# Patient Record
Sex: Male | Born: 1980 | Race: White | Hispanic: No | Marital: Married | Smoking: Never smoker
Health system: Southern US, Community
[De-identification: ages and names within clinical notes are randomized; demographics above are authoritative.]

## PROBLEM LIST (undated history)

## (undated) DIAGNOSIS — Z91018 Allergy to other foods: Secondary | ICD-10-CM

## (undated) HISTORY — PX: NO PAST SURGERIES: SHX2092

---
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
## 2006-03-27 ENCOUNTER — Emergency Department: Payer: Self-pay | Admitting: Unknown Physician Specialty

## 2012-06-05 ENCOUNTER — Ambulatory Visit (INDEPENDENT_AMBULATORY_CARE_PROVIDER_SITE_OTHER): Payer: Federal, State, Local not specified - PPO | Admitting: Internal Medicine

## 2012-06-05 ENCOUNTER — Ambulatory Visit (INDEPENDENT_AMBULATORY_CARE_PROVIDER_SITE_OTHER)
Admission: RE | Admit: 2012-06-05 | Discharge: 2012-06-05 | Disposition: A | Discharge status: Home / Self Care | Payer: Federal, State, Local not specified - PPO | Source: Ambulatory Visit | Attending: Internal Medicine | Admitting: Internal Medicine

## 2012-06-05 ENCOUNTER — Encounter: Payer: Self-pay | Admitting: Internal Medicine

## 2012-06-05 VITALS — BP 131/89 | HR 65 | Temp 98.6°F | Ht 68.0 in | Wt 248.0 lb

## 2012-06-05 DIAGNOSIS — R059 Cough, unspecified: Secondary | ICD-10-CM

## 2012-06-05 DIAGNOSIS — R05 Cough: Secondary | ICD-10-CM

## 2012-06-05 DIAGNOSIS — Z0000 Encounter for general adult medical examination without abnormal findings: Secondary | ICD-10-CM

## 2012-06-05 DIAGNOSIS — J40 Bronchitis, not specified as acute or chronic: Secondary | ICD-10-CM

## 2012-06-05 LAB — LDL CHOLESTEROL, DIRECT: Direct LDL: 137 mg/dL

## 2012-06-05 LAB — CBC WITH DIFFERENTIAL/PLATELET
Basophils Absolute: 0 10*3/uL (ref 0.0–0.1)
Basophils Relative: 0.5 % (ref 0.0–3.0)
Eosinophils Relative: 2.5 % (ref 0.0–5.0)
HCT: 46.6 % (ref 39.0–52.0)
Hemoglobin: 15.7 g/dL (ref 13.0–17.0)
Lymphocytes Relative: 27.5 % (ref 12.0–46.0)
Lymphs Abs: 2.3 10*3/uL (ref 0.7–4.0)
Monocytes Relative: 7.2 % (ref 3.0–12.0)
Neutro Abs: 5.1 10*3/uL (ref 1.4–7.7)
RBC: 5.01 Mil/uL (ref 4.22–5.81)
RDW: 12.8 % (ref 11.5–14.6)

## 2012-06-05 LAB — COMPREHENSIVE METABOLIC PANEL
ALT: 37 U/L (ref 0–53)
Albumin: 4.7 g/dL (ref 3.5–5.2)
Alkaline Phosphatase: 48 U/L (ref 39–117)
CO2: 27 mEq/L (ref 19–32)
Glucose, Bld: 93 mg/dL (ref 70–99)
Potassium: 3.9 mEq/L (ref 3.5–5.1)
Sodium: 139 mEq/L (ref 135–145)
Total Protein: 7.7 g/dL (ref 6.0–8.3)

## 2012-06-05 LAB — LIPID PANEL
Cholesterol: 206 mg/dL — ABNORMAL HIGH (ref 0–200)
Total CHOL/HDL Ratio: 5

## 2012-06-05 MED ORDER — LEVOFLOXACIN 500 MG PO TABS: 500.0000 mg | ORAL_TABLET | Freq: Every day | ORAL | Status: DC

## 2012-06-05 MED ORDER — BENZONATATE 200 MG PO CAPS: 200.0000 mg | ORAL_CAPSULE | Freq: Two times a day (BID) | ORAL | Status: DC | PRN

## 2012-06-05 NOTE — Progress Notes (Signed)
  Subjective:    Patient ID: Gabriel Dominguez, male    DOB: [birth year], 31 y.o.   MRN: [id number]  HPI 31 year old male presents to establish care. He reports that he has been generally healthy. He has never had a primary care physician. His primary concern today is cough. He reports he first developed cough approximately 3 months ago. The cough seems most intense in the evenings. It is productive of whitish sputum. He occasionally has coughing spells which are so intense that he vomits. He denies any chest pain, fever, chills, shortness of breath. He is not currently taking any medication for cough. He is not a smoker. He has no history of asthma. 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  Outpatient Encounter Prescriptions as of 06/05/2012  Medication Sig Dispense Refill  . benzonatate (TESSALON) 200 MG capsule Take 1 capsule (200 mg total) by mouth 2 (two) times daily as needed for cough.  20 capsule  0  . levofloxacin (LEVAQUIN) 500 MG tablet Take 1 tablet (500 mg total) by mouth daily.  7 tablet  0   BP 131/89  Pulse 65  Temp 98.6 F (37 C) (Oral)  Ht 5\' 8"  (1.727 m)  Wt 248 lb (112.492 kg)  BMI 37.71 kg/m2  SpO2 98%  Review of Systems  Constitutional: Negative for fever, chills, activity change, appetite change, fatigue and unexpected weight change.  HENT: Negative for rhinorrhea, postnasal drip and sinus pressure.   Eyes: Negative for visual disturbance.  Respiratory: Positive for cough. Negative for shortness of breath.   Cardiovascular: Negative for chest pain, palpitations and leg swelling.  Gastrointestinal: Negative for abdominal pain and abdominal distention.  Genitourinary: Negative for dysuria, urgency and difficulty urinating.  Musculoskeletal: Negative for arthralgias and gait problem.  Skin: Negative for color change and rash.  Hematological: Negative for adenopathy.  Psychiatric/Behavioral: Negative for sleep disturbance and dysphoric mood. The  patient is not nervous/anxious.        Objective:   Physical Exam  Constitutional: He is oriented to person, place, and time. He appears well-developed and well-nourished. No distress.  HENT:  Head: Normocephalic and atraumatic.  Right Ear: External ear normal.  Left Ear: External ear normal.  Nose: Nose normal.  Mouth/Throat: Oropharynx is clear and moist. No oropharyngeal exudate.  Eyes: Conjunctivae normal and EOM are normal. Pupils are equal, round, and reactive to light. Right eye exhibits no discharge. Left eye exhibits no discharge. No scleral icterus.  Neck: Normal range of motion. Neck supple. No tracheal deviation present. No thyromegaly present.  Cardiovascular: Normal rate, regular rhythm and normal heart sounds.  Exam reveals no gallop and no friction rub.   No murmur heard. Pulmonary/Chest: Effort normal. No accessory muscle usage. Not tachypneic. No respiratory distress. He has no decreased breath sounds. He has no wheezes. He has rhonchi in the right lower field. He has no rales. He exhibits no tenderness.  Musculoskeletal: Normal range of motion. He exhibits no edema.  Lymphadenopathy:    He has no cervical adenopathy.  Neurological: He is alert and oriented to person, place, and time. No cranial nerve deficit. Coordination normal.  Skin: Skin is warm and dry. No rash noted. He is not diaphoretic. No erythema. No pallor.  Psychiatric: He has a normal mood and affect. His behavior is normal. Judgment and thought content normal.          Assessment & Plan:

## 2012-06-05 NOTE — Assessment & Plan Note (Signed)
Persistent cough x3 months most consistent with viral infection versus pertussis. Chest x-ray is normal today. Given rhonchi noted in right lower lobe on exam, will treat for possible bronchitis with Levaquin. Patient will call if symptoms are not improving. Followup one month.

## 2012-06-05 NOTE — Assessment & Plan Note (Signed)
Exam is most consistent with secondary bronchitis by reduction. Will treat with Levaquin. Patient will call if symptoms are not improving over the next few days. If no improvement, would favor additional evaluation with pulmonary function testing given history of chronic exposure to environmental toxins at his work.

## 2012-06-07 LAB — BORDETELLA PERTUSSIS ANTIBODY: B pertussis IgA Ab, Quant: 0.6 U/mL (ref <=1.1)

## 2012-07-03 ENCOUNTER — Ambulatory Visit (INDEPENDENT_AMBULATORY_CARE_PROVIDER_SITE_OTHER): Payer: Federal, State, Local not specified - PPO | Admitting: Internal Medicine

## 2012-07-03 ENCOUNTER — Encounter: Payer: Self-pay | Admitting: Internal Medicine

## 2012-07-03 VITALS — BP 122/86 | HR 74 | Temp 97.9°F | Resp 16 | Wt 249.2 lb

## 2012-07-03 DIAGNOSIS — Z1331 Encounter for screening for depression: Secondary | ICD-10-CM

## 2012-07-03 DIAGNOSIS — R05 Cough: Secondary | ICD-10-CM

## 2012-07-03 DIAGNOSIS — E669 Obesity, unspecified: Secondary | ICD-10-CM

## 2012-07-03 NOTE — Assessment & Plan Note (Signed)
BMI 37. Encouarged healthy Mediterranean style diet low in saturated fat and high in fiber. Encouraged regular physical activity with goal of 40 minutes 3 times per week.

## 2012-07-03 NOTE — Progress Notes (Signed)
  Subjective:    Patient ID: Gabriel Dominguez, male    DOB: 1980/08/05, 31 y.o.   MRN: [id number]  HPI 31 year old male presents for followup of cough. At his last visit, he complained of several month history of cough. He was treated with Levaquin for bronchitis. He reports that symptoms of cough have almost completely resolved. He denies any shortness of breath, fever, chills, chest pain. He reports he is generally feeling well. He has no new concerns today.  Outpatient Encounter Prescriptions as of 07/03/2012  Medication Sig Dispense Refill   BP 122/86  Pulse 74  Temp 97.9 F (36.6 C) (Oral)  Resp 16  Wt 249 lb 4 oz (113.059 kg)  Review of Systems  Constitutional: Negative for fever, chills, activity change, appetite change, fatigue and unexpected weight change.  Eyes: Negative for visual disturbance.  Respiratory: Negative for cough and shortness of breath.   Cardiovascular: Negative for chest pain, palpitations and leg swelling.  Gastrointestinal: Negative for abdominal pain and abdominal distention.  Genitourinary: Negative for dysuria, urgency and difficulty urinating.  Musculoskeletal: Negative for arthralgias and gait problem.  Skin: Negative for color change and rash.  Hematological: Negative for adenopathy.  Psychiatric/Behavioral: Negative for sleep disturbance and dysphoric mood. The patient is not nervous/anxious.        Objective:   Physical Exam  Constitutional: He is oriented to person, place, and time. He appears well-developed and well-nourished. No distress.  HENT:  Head: Normocephalic and atraumatic.  Right Ear: External ear normal.  Left Ear: External ear normal.  Nose: Nose normal.  Mouth/Throat: Oropharynx is clear and moist. No oropharyngeal exudate.  Eyes: Conjunctivae normal and EOM are normal. Pupils are equal, round, and reactive to light. Right eye exhibits no discharge. Left eye exhibits no discharge. No scleral icterus.  Neck: Normal range of motion.  Neck supple. No tracheal deviation present. No thyromegaly present.  Cardiovascular: Normal rate, regular rhythm and normal heart sounds.  Exam reveals no gallop and no friction rub.   No murmur heard. Pulmonary/Chest: Effort normal and breath sounds normal. No accessory muscle usage. Not tachypneic. No respiratory distress. He has no decreased breath sounds. He has no wheezes. He has no rhonchi. He has no rales. He exhibits no tenderness.  Musculoskeletal: Normal range of motion. He exhibits no edema.  Lymphadenopathy:    He has no cervical adenopathy.  Neurological: He is alert and oriented to person, place, and time. No cranial nerve deficit. Coordination normal.  Skin: Skin is warm and dry. No rash noted. He is not diaphoretic. No erythema. No pallor.  Psychiatric: He has a normal mood and affect. His behavior is normal. Judgment and thought content normal.          Assessment & Plan:

## 2012-07-03 NOTE — Assessment & Plan Note (Addendum)
Cough has markedly improved after Rx with Levaquin. CXR was normal. Will continue to monitor. Pt does have some exposure to pulmonary toxins with work. We discussed potential PFTsj and pulmonary referral if symptoms of cough persistent. Otherwise, follow up 1 year and prn.

## 2012-09-07 ENCOUNTER — Ambulatory Visit: Payer: Self-pay | Admitting: Physician Assistant

## 2012-10-26 ENCOUNTER — Emergency Department: Payer: Self-pay | Admitting: Emergency Medicine

## 2013-03-03 ENCOUNTER — Encounter: Payer: Self-pay | Admitting: Internal Medicine

## 2013-04-16 ENCOUNTER — Ambulatory Visit (INDEPENDENT_AMBULATORY_CARE_PROVIDER_SITE_OTHER): Payer: Federal, State, Local not specified - PPO | Admitting: Internal Medicine

## 2013-04-16 ENCOUNTER — Encounter: Payer: Self-pay | Admitting: Internal Medicine

## 2013-04-16 VITALS — BP 120/88 | HR 74 | Temp 97.9°F | Wt 252.0 lb

## 2013-04-16 DIAGNOSIS — J0100 Acute maxillary sinusitis, unspecified: Secondary | ICD-10-CM | POA: Insufficient documentation

## 2013-04-16 MED ORDER — AMOXICILLIN-POT CLAVULANATE 875-125 MG PO TABS: 1.0000 | ORAL_TABLET | Freq: Two times a day (BID) | ORAL | Status: DC

## 2013-04-16 NOTE — Assessment & Plan Note (Signed)
Symptoms and exam consistent with acute maxillary sinusitis and bilateral OM. Will start Augmentin. Will start ClaritinD to help with congestion. Encouraged rest and adequate fluid intake. Will recheck ears in 4 weeks and prn.

## 2013-04-16 NOTE — Patient Instructions (Signed)
Try starting Claritin-D 12hour for congestion.  Start Augmentin for ear and sinus infection.  Recheck in 4 weeks or sooner if needed.

## 2013-04-16 NOTE — Progress Notes (Signed)
  Subjective:    Patient ID: Gabriel Dominguez, male    DOB: 02/01/1981, 32 y.o.   MRN: [id number]  HPI 32YO male presents for acute visit c/o 1 month of congestion.  Initially started with clear nasal drainage, malaise. Took benadryl with minimal improvement. Over last 2 weeks, has developed increased pressure right maxilla and right ear. Denies fever or chills. Denies cough. Not taking any meds for this at present.  Outpatient Encounter Prescriptions as of 04/16/2013  Medication Sig Dispense Refill   No facility-administered encounter medications on file as of 04/16/2013.   BP 120/88  Pulse 74  Temp(Src) 97.9 F (36.6 C) (Oral)  Wt 252 lb (114.306 kg)  BMI 38.33 kg/m2  SpO2 94%  Review of Systems  Constitutional: Positive for fatigue. Negative for fever, chills and activity change.  HENT: Positive for ear pain, congestion, postnasal drip and sinus pressure. Negative for hearing loss, nosebleeds, sore throat, rhinorrhea, sneezing, trouble swallowing, neck pain, neck stiffness, voice change, tinnitus and ear discharge.   Eyes: Negative for discharge, redness, itching and visual disturbance.  Respiratory: Negative for cough, chest tightness, shortness of breath, wheezing and stridor.   Cardiovascular: Negative for chest pain and leg swelling.  Musculoskeletal: Negative for myalgias and arthralgias.  Skin: Negative for color change and rash.  Neurological: Negative for dizziness, facial asymmetry and headaches.  Psychiatric/Behavioral: Negative for sleep disturbance.       Objective:   Physical Exam  Constitutional: He is oriented to person, place, and time. He appears well-developed and well-nourished. No distress.  HENT:  Head: Normocephalic and atraumatic.  Right Ear: External ear normal. Tympanic membrane is erythematous and bulging. A middle ear effusion is present.  Left Ear: External ear normal. Tympanic membrane is erythematous and bulging. A middle ear effusion is present.   Nose: Mucosal edema, rhinorrhea and sinus tenderness present.  Mouth/Throat: Oropharynx is clear and moist. No oropharyngeal exudate.  Eyes: Conjunctivae and EOM are normal. Pupils are equal, round, and reactive to light. Right eye exhibits no discharge. Left eye exhibits no discharge. No scleral icterus.  Neck: Normal range of motion. Neck supple. No tracheal deviation present. No thyromegaly present.  Cardiovascular: Normal rate, regular rhythm and normal heart sounds.  Exam reveals no gallop and no friction rub.   No murmur heard. Pulmonary/Chest: Effort normal and breath sounds normal. No respiratory distress. He has no wheezes. He has no rales. He exhibits no tenderness.  Musculoskeletal: Normal range of motion. He exhibits no edema.  Lymphadenopathy:    He has no cervical adenopathy.  Neurological: He is alert and oriented to person, place, and time. No cranial nerve deficit. Coordination normal.  Skin: Skin is warm and dry. No rash noted. He is not diaphoretic. No erythema. No pallor.  Psychiatric: He has a normal mood and affect. His behavior is normal. Judgment and thought content normal.          Assessment & Plan:

## 2013-05-13 ENCOUNTER — Ambulatory Visit: Payer: Federal, State, Local not specified - PPO | Admitting: Internal Medicine

## 2013-05-22 ENCOUNTER — Other Ambulatory Visit: Payer: Self-pay

## 2013-07-03 ENCOUNTER — Encounter: Payer: Federal, State, Local not specified - PPO | Admitting: Internal Medicine

## 2013-07-04 ENCOUNTER — Encounter: Payer: Self-pay | Admitting: Internal Medicine

## 2013-07-04 ENCOUNTER — Ambulatory Visit (INDEPENDENT_AMBULATORY_CARE_PROVIDER_SITE_OTHER): Payer: Federal, State, Local not specified - PPO | Admitting: Internal Medicine

## 2013-07-04 VITALS — BP 130/70 | HR 90 | Temp 97.8°F | Ht 68.0 in | Wt 260.0 lb

## 2013-07-04 DIAGNOSIS — Z0000 Encounter for general adult medical examination without abnormal findings: Secondary | ICD-10-CM

## 2013-07-04 DIAGNOSIS — J0100 Acute maxillary sinusitis, unspecified: Secondary | ICD-10-CM

## 2013-07-04 DIAGNOSIS — E669 Obesity, unspecified: Secondary | ICD-10-CM

## 2013-07-04 LAB — CBC WITH DIFFERENTIAL/PLATELET
Basophils Absolute: 0 10*3/uL (ref 0.0–0.1)
Basophils Relative: 0 % (ref 0–1)
HCT: 44.4 % (ref 39.0–52.0)
Hemoglobin: 15.9 g/dL (ref 13.0–17.0)
Lymphocytes Relative: 33 % (ref 12–46)
Lymphs Abs: 3 10*3/uL (ref 0.7–4.0)
MCV: 88.3 fL (ref 78.0–100.0)
Monocytes Absolute: 0.8 10*3/uL (ref 0.1–1.0)
Monocytes Relative: 9 % (ref 3–12)
Neutro Abs: 4.8 10*3/uL (ref 1.7–7.7)
Neutrophils Relative %: 53 % (ref 43–77)
RDW: 13.4 % (ref 11.5–15.5)
WBC: 9.1 10*3/uL (ref 4.0–10.5)

## 2013-07-04 LAB — COMPREHENSIVE METABOLIC PANEL
ALT: 42 U/L (ref 0–53)
AST: 30 U/L (ref 0–37)
Albumin: 4.7 g/dL (ref 3.5–5.2)
Alkaline Phosphatase: 51 U/L (ref 39–117)
BUN: 13 mg/dL (ref 6–23)
CO2: 27 mEq/L (ref 19–32)
Creat: 1.15 mg/dL (ref 0.50–1.35)
Potassium: 4.3 mEq/L (ref 3.5–5.3)
Sodium: 137 mEq/L (ref 135–145)
Total Bilirubin: 0.5 mg/dL (ref 0.3–1.2)
Total Protein: 6.7 g/dL (ref 6.0–8.3)

## 2013-07-04 LAB — LIPID PANEL
HDL: 42 mg/dL (ref >39)
LDL Cholesterol: 115 mg/dL — ABNORMAL HIGH (ref 0–99)
VLDL: 45 mg/dL — ABNORMAL HIGH (ref 0–40)

## 2013-07-04 MED ORDER — GUAIFENESIN-CODEINE 100-10 MG/5ML PO SYRP: 5.0000 mL | ORAL_SOLUTION | Freq: Two times a day (BID) | ORAL | Status: DC | PRN

## 2013-07-04 MED ORDER — LEVOFLOXACIN 500 MG PO TABS: 500.0000 mg | ORAL_TABLET | Freq: Every day | ORAL | Status: DC

## 2013-07-04 NOTE — Progress Notes (Signed)
Pre visit review using our clinic review tool, if applicable. No additional management support is needed unless otherwise documented below in the visit note. 

## 2013-07-05 DIAGNOSIS — Z0000 Encounter for general adult medical examination without abnormal findings: Secondary | ICD-10-CM | POA: Insufficient documentation

## 2013-07-05 NOTE — Assessment & Plan Note (Signed)
General medical exam normal today. Encouraged healthy diet and regular physical activity. Immunizations are up-to-date. Will check labs including CBC, CMP, lipid profile.

## 2013-07-05 NOTE — Assessment & Plan Note (Signed)
Symptoms and exam consistent with recurrent maxillary sinusitis. Will start Levaquin. Will use Codeine as needed for cough. Follow up if no improvement over next few days. We discussed referral to ENT if persistent symptoms, given recurrent h/o sinusitis.

## 2013-07-05 NOTE — Progress Notes (Signed)
   Subjective:    Patient ID: Gabriel Dominguez, male    DOB: September 15, 1980, 32 y.o.   MRN: [id number]  HPI 32 year old male presents for annual exam. He reports that he is generally feeling well except for recurrent symptoms of nasal congestion, sinus pressure and pain, and purulent sinus drainage. Symptoms initially improved with use of Augmentin in October 2014, however they have recurred over the last couple of weeks. He denies any fever or chills. He does have some nonproductive cough. He denies shortness of breath.  Aside from this, no concerns. He is up-to-date on his immunizations. He is planning to start a healthier diet at the start of the new year. He is also planning to start an exercise program. He is not currently following any diet or exercise program.  Outpatient Encounter Prescriptions as of 07/04/2013  Medication Sig   BP 130/70  Pulse 90  Temp(Src) 97.8 F (36.6 C) (Oral)  Ht 5\' 8"  (1.727 m)  Wt 260 lb (117.935 kg)  BMI 39.54 kg/m2  SpO2 95%  Review of Systems  Constitutional: Negative for fever, chills, activity change, appetite change, fatigue and unexpected weight change.  HENT: Positive for congestion, postnasal drip, rhinorrhea and sinus pressure.   Eyes: Negative for visual disturbance.  Respiratory: Positive for cough. Negative for shortness of breath.   Cardiovascular: Negative for chest pain, palpitations and leg swelling.  Gastrointestinal: Negative for abdominal pain and abdominal distention.  Genitourinary: Negative for dysuria, urgency and difficulty urinating.  Musculoskeletal: Negative for arthralgias and gait problem.  Skin: Negative for color change and rash.  Hematological: Negative for adenopathy.  Psychiatric/Behavioral: Negative for sleep disturbance and dysphoric mood. The patient is not nervous/anxious.        Objective:   Physical Exam  Constitutional: He is oriented to person, place, and time. He appears well-developed and well-nourished. No  distress.  HENT:  Head: Normocephalic and atraumatic.  Right Ear: External ear normal. A middle ear effusion is present.  Left Ear: External ear normal. A middle ear effusion is present.  Nose: Mucosal edema present. Right sinus exhibits maxillary sinus tenderness. Left sinus exhibits maxillary sinus tenderness.  Mouth/Throat: Oropharynx is clear and moist. No oropharyngeal exudate.  Eyes: Conjunctivae and EOM are normal. Pupils are equal, round, and reactive to light. Right eye exhibits no discharge. Left eye exhibits no discharge. No scleral icterus.  Neck: Normal range of motion. Neck supple. No tracheal deviation present. No thyromegaly present.  Cardiovascular: Normal rate, regular rhythm and normal heart sounds.  Exam reveals no gallop and no friction rub.   No murmur heard. Pulmonary/Chest: Effort normal and breath sounds normal. No respiratory distress. He has no wheezes. He has no rales. He exhibits no tenderness.  Musculoskeletal: Normal range of motion. He exhibits no edema.  Lymphadenopathy:    He has no cervical adenopathy.  Neurological: He is alert and oriented to person, place, and time. No cranial nerve deficit. Coordination normal.  Skin: Skin is warm and dry. No rash noted. He is not diaphoretic. No erythema. No pallor.  Psychiatric: He has a normal mood and affect. His behavior is normal. Judgment and thought content normal.          Assessment & Plan:

## 2013-07-05 NOTE — Assessment & Plan Note (Signed)
Wt Readings from Last 3 Encounters:  07/04/13 260 lb (117.935 kg)  04/16/13 252 lb (114.306 kg)  07/03/12 249 lb 4 oz (113.059 kg)   Body mass index is 39.54 kg/(m^2).  Encouraged healthy, Mediterranean style diet and regular exercise with goal of exercise 3x per week.

## 2013-07-20 ENCOUNTER — Encounter: Payer: Self-pay | Admitting: Internal Medicine

## 2013-07-22 ENCOUNTER — Encounter: Payer: Self-pay | Admitting: Internal Medicine

## 2013-07-22 ENCOUNTER — Ambulatory Visit (INDEPENDENT_AMBULATORY_CARE_PROVIDER_SITE_OTHER): Payer: Federal, State, Local not specified - PPO | Admitting: Internal Medicine

## 2013-07-22 VITALS — BP 110/80 | HR 94 | Temp 98.2°F | Wt 259.0 lb

## 2013-07-22 DIAGNOSIS — J0100 Acute maxillary sinusitis, unspecified: Secondary | ICD-10-CM

## 2013-07-22 DIAGNOSIS — J0101 Acute recurrent maxillary sinusitis: Secondary | ICD-10-CM

## 2013-07-22 MED ORDER — PREDNISONE (PAK) 10 MG PO TABS: ORAL_TABLET | ORAL | Status: DC

## 2013-07-22 MED ORDER — GUAIFENESIN-CODEINE 100-10 MG/5ML PO SYRP: 5.0000 mL | ORAL_SOLUTION | Freq: Two times a day (BID) | ORAL | Status: DC | PRN

## 2013-07-22 MED ORDER — SULFAMETHOXAZOLE-TMP DS 800-160 MG PO TABS: 1.0000 | ORAL_TABLET | Freq: Two times a day (BID) | ORAL | Status: DC

## 2013-07-22 NOTE — Progress Notes (Signed)
Pre-visit discussion using our clinic review tool. No additional management support is needed unless otherwise documented below in the visit note.  

## 2013-07-22 NOTE — Assessment & Plan Note (Signed)
Persistent acute maxillary sinusitis. No improvement with Levaquin. Question if he may have obstruction leading to chronic infection. Will set up ENT evaluation. Will start Prednisone and Bactrim. Codeine prn cough. Follow up 1/20.

## 2013-07-22 NOTE — Progress Notes (Signed)
   Subjective:    Patient ID: Gabriel Dominguez, male    DOB: 1981-01-13, 33 y.o.   MRN: [id number]  HPI 33YO male with h/o sinusitis, treated with Levaquin in 06/2013, presents for follow up. Persistent symptoms of sinus congestion, left cheek pressure and pain, and non-productive cough. No fever, chills, shortness of breath. Completed Levaquin with no improvement.  Outpatient Encounter Prescriptions as of 07/22/2013  Medication Sig   BP 110/80  Pulse 94  Temp(Src) 98.2 F (36.8 C) (Oral)  Wt 259 lb (117.482 kg)  SpO2 97%  Review of Systems  Constitutional: Positive for fatigue. Negative for fever, chills and activity change.  HENT: Positive for congestion and sinus pressure. Negative for ear discharge, ear pain, hearing loss, nosebleeds, postnasal drip, rhinorrhea, sneezing, sore throat, tinnitus, trouble swallowing and voice change.   Eyes: Negative for discharge, redness, itching and visual disturbance.  Respiratory: Positive for cough. Negative for chest tightness, shortness of breath, wheezing and stridor.   Cardiovascular: Negative for chest pain and leg swelling.  Musculoskeletal: Negative for arthralgias, myalgias, neck pain and neck stiffness.  Skin: Negative for color change and rash.  Neurological: Negative for dizziness, facial asymmetry and headaches.  Psychiatric/Behavioral: Negative for sleep disturbance.       Objective:   Physical Exam  Constitutional: He is oriented to person, place, and time. He appears well-developed and well-nourished. No distress.  HENT:  Head: Normocephalic and atraumatic.  Right Ear: External ear normal.  Left Ear: External ear normal.  Nose: Mucosal edema and rhinorrhea present. Right sinus exhibits maxillary sinus tenderness. Left sinus exhibits maxillary sinus tenderness.  Mouth/Throat: Oropharynx is clear and moist. No oropharyngeal exudate.  Eyes: Conjunctivae and EOM are normal. Pupils are equal, round, and reactive to light. Right eye  exhibits no discharge. Left eye exhibits no discharge. No scleral icterus.  Neck: Normal range of motion. Neck supple. No tracheal deviation present. No thyromegaly present.  Cardiovascular: Normal rate, regular rhythm and normal heart sounds.  Exam reveals no gallop and no friction rub.   No murmur heard. Pulmonary/Chest: Effort normal and breath sounds normal. No accessory muscle usage. Not tachypneic. No respiratory distress. He has no decreased breath sounds. He has no wheezes. He has no rhonchi. He has no rales. He exhibits no tenderness.  Musculoskeletal: Normal range of motion. He exhibits no edema.  Lymphadenopathy:    He has no cervical adenopathy.  Neurological: He is alert and oriented to person, place, and time. No cranial nerve deficit. Coordination normal.  Skin: Skin is warm and dry. No rash noted. He is not diaphoretic. No erythema. No pallor.  Psychiatric: He has a normal mood and affect. His behavior is normal. Judgment and thought content normal.          Assessment & Plan:

## 2013-08-05 ENCOUNTER — Ambulatory Visit (INDEPENDENT_AMBULATORY_CARE_PROVIDER_SITE_OTHER): Payer: Federal, State, Local not specified - PPO | Admitting: Internal Medicine

## 2013-08-05 ENCOUNTER — Encounter: Payer: Self-pay | Admitting: Internal Medicine

## 2013-08-05 VITALS — BP 120/80 | HR 112 | Temp 98.7°F | Wt 262.0 lb

## 2013-08-05 DIAGNOSIS — E669 Obesity, unspecified: Secondary | ICD-10-CM

## 2013-08-05 DIAGNOSIS — J0100 Acute maxillary sinusitis, unspecified: Secondary | ICD-10-CM

## 2013-08-05 MED ORDER — PHENTERMINE HCL 37.5 MG PO CAPS: 37.5000 mg | ORAL_CAPSULE | ORAL | Status: DC

## 2013-08-05 NOTE — Progress Notes (Signed)
Pre-visit discussion using our clinic review tool. No additional management support is needed unless otherwise documented below in the visit note.  

## 2013-08-06 NOTE — Progress Notes (Signed)
   Subjective:    Patient ID: Gabriel Dominguez, male    DOB: [birth year], 33 y.o.   MRN: [id number]  HPI 33 year old male with history of recurrent sinusitis presents for followup. At his last visit, he had persistent symptoms of sinus congestion and pain despite treatment with multiple antibiotics and prednisone. We set him up with ENT for evaluation. He reports that they did CT of the sinuses which showed complete obstruction of the right maxillary sinus. They also performed a culture which showed MRSA per his report. He has been started on clindamycin 3 times daily and notes some improvement in pain and pressure in his right sinus. He denies any fever, chills, cough.  He is concerned about his weight. He is interested in starting medication to help suppress his appetite. He is trying to follow a healthy diet and get regular physical activity.  Outpatient Encounter Prescriptions as of 08/05/2013  Medication Sig  . clindamycin (CLEOCIN) 300 MG capsule Take 300 mg by mouth 3 (three) times daily.     Review of Systems  Constitutional: Negative for fever, chills, activity change, appetite change, fatigue and unexpected weight change.  HENT: Positive for congestion and sinus pressure.   Eyes: Negative for visual disturbance.  Respiratory: Negative for cough and shortness of breath.   Cardiovascular: Negative for chest pain, palpitations and leg swelling.  Gastrointestinal: Negative for abdominal pain and abdominal distention.  Genitourinary: Negative for dysuria, urgency and difficulty urinating.  Musculoskeletal: Negative for arthralgias and gait problem.  Skin: Negative for color change and rash.  Hematological: Negative for adenopathy.  Psychiatric/Behavioral: Negative for sleep disturbance and dysphoric mood. The patient is not nervous/anxious.        Objective:   Physical Exam  Constitutional: He is oriented to person, place, and time. He appears well-developed and well-nourished. No  distress.  HENT:  Head: Normocephalic and atraumatic.  Right Ear: External ear normal.  Left Ear: External ear normal.  Nose: Mucosal edema present. Right sinus exhibits maxillary sinus tenderness.  Mouth/Throat: Oropharynx is clear and moist. No oropharyngeal exudate.  Eyes: Conjunctivae and EOM are normal. Pupils are equal, round, and reactive to light. Right eye exhibits no discharge. Left eye exhibits no discharge. No scleral icterus.  Neck: Normal range of motion. Neck supple. No tracheal deviation present. No thyromegaly present.  Cardiovascular: Normal rate, regular rhythm and normal heart sounds.  Exam reveals no gallop and no friction rub.   No murmur heard. Pulmonary/Chest: Effort normal and breath sounds normal. No respiratory distress. He has no wheezes. He has no rales. He exhibits no tenderness.  Musculoskeletal: Normal range of motion. He exhibits no edema.  Lymphadenopathy:    He has no cervical adenopathy.  Neurological: He is alert and oriented to person, place, and time. No cranial nerve deficit. Coordination normal.  Skin: Skin is warm and dry. No rash noted. He is not diaphoretic. No erythema. No pallor.  Psychiatric: He has a normal mood and affect. His behavior is normal. Judgment and thought content normal.          Assessment & Plan:

## 2013-08-06 NOTE — Assessment & Plan Note (Signed)
Wt Readings from Last 3 Encounters:  08/05/13 262 lb (118.842 kg)  07/22/13 259 lb (117.482 kg)  07/04/13 260 lb (117.935 kg)   Encouraged caloric restriction, high fiber, low sugar diet. Will start phentermine to help with appetite suppression. We discussed potential side effects of phentermine, including palpitations, dry mouth, constipation, trouble sleeping. He will stop the medication if any problems arise.

## 2013-08-06 NOTE — Assessment & Plan Note (Signed)
Persistent sinusitis, seen by ENT, had culture which showed MRSA by report. Will request records on this. On Clindamycin with some improvement. Will follow.

## 2013-09-05 ENCOUNTER — Encounter: Payer: Self-pay | Admitting: Internal Medicine

## 2013-09-11 ENCOUNTER — Ambulatory Visit: Payer: Federal, State, Local not specified - PPO | Admitting: Internal Medicine

## 2013-10-14 ENCOUNTER — Encounter: Payer: Self-pay | Admitting: Internal Medicine

## 2013-10-14 ENCOUNTER — Ambulatory Visit (INDEPENDENT_AMBULATORY_CARE_PROVIDER_SITE_OTHER): Payer: Federal, State, Local not specified - PPO | Admitting: Internal Medicine

## 2013-10-14 VITALS — BP 108/80 | HR 89 | Temp 98.2°F | Wt 223.0 lb

## 2013-10-14 DIAGNOSIS — E669 Obesity, unspecified: Secondary | ICD-10-CM

## 2013-10-14 MED ORDER — PHENTERMINE HCL 37.5 MG PO CAPS: 37.5000 mg | ORAL_CAPSULE | ORAL | Status: DC

## 2013-10-14 NOTE — Progress Notes (Signed)
Pre visit review using our clinic review tool, if applicable. No additional management support is needed unless otherwise documented below in the visit note. 

## 2013-10-14 NOTE — Assessment & Plan Note (Addendum)
Wt Readings from Last 3 Encounters:  10/14/13 223 lb (101.152 kg)  08/05/13 262 lb (118.842 kg)  07/22/13 259 lb (117.482 kg)   Congratulated pt on weight loss. Encouraged continued efforts at healthy diet and regular exercise. Will continue phentermine to help with appetite suppression. Follow up 3 months and prn.

## 2013-10-14 NOTE — Progress Notes (Signed)
   Subjective:    Patient ID: Gabriel Dominguez, male    DOB: Sep 30, 1980, 33 y.o.   MRN: [id number]  HPI 33YO male with obesity presents for follow up. Following healthy diet, eliminated sweetened beverages and fried foods. Staying physically active. Taking phentermine to help with appetite suppression. No side effects noted from this medication.  Review of Systems  Constitutional: Negative for fever, chills, activity change, appetite change, fatigue and unexpected weight change.  Eyes: Negative for visual disturbance.  Respiratory: Negative for cough and shortness of breath.   Cardiovascular: Negative for chest pain, palpitations and leg swelling.  Gastrointestinal: Negative for abdominal pain and abdominal distention.  Genitourinary: Negative for dysuria, urgency and difficulty urinating.  Musculoskeletal: Negative for arthralgias and gait problem.  Skin: Negative for color change and rash.  Hematological: Negative for adenopathy.  Psychiatric/Behavioral: Negative for sleep disturbance and dysphoric mood. The patient is not nervous/anxious.        Objective:    BP 108/80  Pulse 89  Temp(Src) 98.2 F (36.8 C) (Oral)  Wt 223 lb (101.152 kg)  SpO2 97% Physical Exam  Constitutional: He is oriented to person, place, and time. He appears well-developed and well-nourished. No distress.  HENT:  Head: Normocephalic and atraumatic.  Right Ear: External ear normal.  Left Ear: External ear normal.  Nose: Nose normal.  Mouth/Throat: Oropharynx is clear and moist. No oropharyngeal exudate.  Eyes: Conjunctivae and EOM are normal. Pupils are equal, round, and reactive to light. Right eye exhibits no discharge. Left eye exhibits no discharge. No scleral icterus.  Neck: Normal range of motion. Neck supple. No tracheal deviation present. No thyromegaly present.  Cardiovascular: Normal rate, regular rhythm and normal heart sounds.  Exam reveals no gallop and no friction rub.   No murmur  heard. Pulmonary/Chest: Effort normal and breath sounds normal. No accessory muscle usage. Not tachypneic. No respiratory distress. He has no decreased breath sounds. He has no wheezes. He has no rhonchi. He has no rales. He exhibits no tenderness.  Musculoskeletal: Normal range of motion. He exhibits no edema.  Lymphadenopathy:    He has no cervical adenopathy.  Neurological: He is alert and oriented to person, place, and time. No cranial nerve deficit. Coordination normal.  Skin: Skin is warm and dry. No rash noted. He is not diaphoretic. No erythema. No pallor.  Psychiatric: He has a normal mood and affect. His behavior is normal. Judgment and thought content normal.          Assessment & Plan:   Problem List Items Addressed This Visit   Obesity (BMI 30-39.9) - Primary      Wt Readings from Last 3 Encounters:  10/14/13 223 lb (101.152 kg)  08/05/13 262 lb (118.842 kg)  07/22/13 259 lb (117.482 kg)   Congratulated pt on weight loss. Encouraged continued efforts at healthy diet and regular exercise. Will continue phentermine to help with appetite suppression. Follow up 3 months and prn.    Relevant Medications      phentermine capsule       Return in about 3 months (around 01/13/2014).

## 2014-01-13 ENCOUNTER — Ambulatory Visit (INDEPENDENT_AMBULATORY_CARE_PROVIDER_SITE_OTHER): Payer: Federal, State, Local not specified - PPO | Admitting: Internal Medicine

## 2014-01-13 ENCOUNTER — Encounter: Payer: Self-pay | Admitting: Internal Medicine

## 2014-01-13 VITALS — BP 122/80 | HR 104 | Temp 98.0°F | Ht 68.0 in | Wt 196.5 lb

## 2014-01-13 DIAGNOSIS — B360 Pityriasis versicolor: Secondary | ICD-10-CM | POA: Insufficient documentation

## 2014-01-13 DIAGNOSIS — B354 Tinea corporis: Secondary | ICD-10-CM

## 2014-01-13 DIAGNOSIS — E669 Obesity, unspecified: Secondary | ICD-10-CM

## 2014-01-13 MED ORDER — PHENTERMINE HCL 37.5 MG PO CAPS: 37.5000 mg | ORAL_CAPSULE | ORAL | Status: DC

## 2014-01-13 MED ORDER — GRISEOFULVIN MICROSIZE 500 MG PO TABS: 500.0000 mg | ORAL_TABLET | Freq: Every day | ORAL | Status: DC

## 2014-01-13 NOTE — Progress Notes (Signed)
Subjective:    Patient ID: Gabriel Buntingommy J Cordon Jr., male    DOB: Oct 28, 1980, 33 y.o.   MRN: [id number]  HPI 33YO male presents for follow up.  Obesity - Limiting sweets and carbs. Avoiding soda. Stays active at work, but no specific exercise. Would like to taper off phentermine.  Rash - over back, arms for last few weeks. Red and scaly. In past, treated at urgent care by another provider with antifungal medication with improvement. Not generally itchy or painful. No fever, chills, or other symptoms.  Review of Systems  Constitutional: Negative for fever, chills, activity change, appetite change, fatigue and unexpected weight change.  Eyes: Negative for visual disturbance.  Respiratory: Negative for cough and shortness of breath.   Cardiovascular: Negative for chest pain, palpitations and leg swelling.  Gastrointestinal: Negative for abdominal pain and abdominal distention.  Genitourinary: Negative for dysuria, urgency and difficulty urinating.  Musculoskeletal: Negative for arthralgias and gait problem.  Skin: Positive for color change and rash.  Hematological: Negative for adenopathy.  Psychiatric/Behavioral: Negative for sleep disturbance and dysphoric mood. The patient is not nervous/anxious.        Objective:    BP 122/80  Pulse 104  Temp(Src) 98 F (36.7 C) (Oral)  Ht 5\' 8"  (1.727 m)  Wt 196 lb 8 oz (89.132 kg)  BMI 29.88 kg/m2  SpO2 98% Physical Exam  Constitutional: He is oriented to person, place, and time. He appears well-developed and well-nourished. No distress.  HENT:  Head: Normocephalic and atraumatic.  Right Ear: External ear normal.  Left Ear: External ear normal.  Nose: Nose normal.  Mouth/Throat: Oropharynx is clear and moist. No oropharyngeal exudate.  Eyes: Conjunctivae and EOM are normal. Pupils are equal, round, and reactive to light. Right eye exhibits no discharge. Left eye exhibits no discharge. No scleral icterus.  Neck: Normal range of motion. Neck  supple. No tracheal deviation present. No thyromegaly present.  Cardiovascular: Normal rate, regular rhythm and normal heart sounds.  Exam reveals no gallop and no friction rub.   No murmur heard. Pulmonary/Chest: Effort normal and breath sounds normal. No accessory muscle usage. Not tachypneic. No respiratory distress. He has no decreased breath sounds. He has no wheezes. He has no rhonchi. He has no rales. He exhibits no tenderness.  Musculoskeletal: Normal range of motion. He exhibits no edema.  Lymphadenopathy:    He has no cervical adenopathy.  Neurological: He is alert and oriented to person, place, and time. No cranial nerve deficit. Coordination normal.  Skin: Skin is warm and dry. Rash noted. Rash is papular (scaling erythematous rash with annular lesions over back and arms). He is not diaphoretic. No erythema. No pallor.  Psychiatric: He has a normal mood and affect. His behavior is normal. Judgment and thought content normal.          Assessment & Plan:   Problem List Items Addressed This Visit     Unprioritized   Obesity (BMI 30-39.9)      Wt Readings from Last 3 Encounters:  01/13/14 196 lb 8 oz (89.132 kg)  10/14/13 223 lb (101.152 kg)  08/05/13 262 lb (118.842 kg)   Body mass index is 29.88 kg/(m^2). Congratulated pt on weight loss. Encouraged continued healthy diet and exercise. Will gradually taper off Phentermine, using only a couple of times per week over the next few months.    Relevant Medications      phentermine capsule   Tinea corporis - Primary     Rash c/w  tinea corporis. Will start Griseofulvin 500mg  daily x 3 weeks. Pt will call if no improvement in rash.    Relevant Medications      griseofulvin (GRIFULVIN V) 500 MG tablet       Return in about 6 months (around 07/15/2014) for Physical.

## 2014-01-13 NOTE — Assessment & Plan Note (Signed)
Rash c/w tinea corporis. Will start Griseofulvin 500mg  daily x 3 weeks. Pt will call if no improvement in rash.

## 2014-01-13 NOTE — Progress Notes (Signed)
Pre visit review using our clinic review tool, if applicable. No additional management support is needed unless otherwise documented below in the visit note. 

## 2014-01-13 NOTE — Assessment & Plan Note (Signed)
Wt Readings from Last 3 Encounters:  01/13/14 196 lb 8 oz (89.132 kg)  10/14/13 223 lb (101.152 kg)  08/05/13 262 lb (118.842 kg)   Body mass index is 29.88 kg/(m^2). Congratulated pt on weight loss. Encouraged continued healthy diet and exercise. Will gradually taper off Phentermine, using only a couple of times per week over the next few months.

## 2014-01-13 NOTE — Patient Instructions (Signed)
Start Griseofulvin 500mg  daily for three weeks. Email or call with update if rash is not improving.

## 2014-02-03 ENCOUNTER — Encounter: Payer: Self-pay | Admitting: Internal Medicine

## 2014-02-16 ENCOUNTER — Emergency Department: Payer: Self-pay | Admitting: Emergency Medicine

## 2014-02-17 ENCOUNTER — Encounter: Payer: Self-pay | Admitting: Internal Medicine

## 2014-02-17 ENCOUNTER — Ambulatory Visit (INDEPENDENT_AMBULATORY_CARE_PROVIDER_SITE_OTHER): Payer: Federal, State, Local not specified - PPO | Admitting: Internal Medicine

## 2014-02-17 VITALS — BP 102/74 | HR 75 | Temp 98.1°F | Ht 68.0 in | Wt 202.5 lb

## 2014-02-17 DIAGNOSIS — S6702XD Crushing injury of left thumb, subsequent encounter: Secondary | ICD-10-CM

## 2014-02-17 DIAGNOSIS — S6710XA Crushing injury of unspecified finger(s), initial encounter: Secondary | ICD-10-CM

## 2014-02-17 DIAGNOSIS — S6700XA Crushing injury of unspecified thumb, initial encounter: Secondary | ICD-10-CM | POA: Insufficient documentation

## 2014-02-17 DIAGNOSIS — Z5189 Encounter for other specified aftercare: Secondary | ICD-10-CM

## 2014-02-17 DIAGNOSIS — B360 Pityriasis versicolor: Secondary | ICD-10-CM

## 2014-02-17 MED ORDER — FLUCONAZOLE 150 MG PO TABS: 300.0000 mg | ORAL_TABLET | ORAL | Status: DC

## 2014-02-17 NOTE — Progress Notes (Signed)
Pre visit review using our clinic review tool, if applicable. No additional management support is needed unless otherwise documented below in the visit note. 

## 2014-02-17 NOTE — Assessment & Plan Note (Signed)
Exam today seems more consistent with tinea versicolor, which may be why rash did not respond to griseofulvin. Will start Fluconazole 300mg  po weekly x 2 weeks. Follow up with Dermatology if no improvement.

## 2014-02-17 NOTE — Assessment & Plan Note (Signed)
[facility]. Continue Percocet prn pain. Follow up with ortho as scheduled.

## 2014-02-17 NOTE — Progress Notes (Addendum)
Subjective:    Patient ID: Gabriel Dominguez., male    DOB: 04-20-1981, 33 y.o.   MRN: [id number]  HPI 33YO male presents for acute visit.  Crushed left thumb on metal yesterday. Had 9 stitches to thumb in ED yesterday. Follow up planned with Dr. Hyacinth Meeker. Area is painful. Taking Percocet with some improvement.  Rash - Scaling, red, occasionally itchy, located over back and trunk with one spot on right thigh. Previously treated with Griseofulvin. No improvement noted. No fever, chills. No other noted symptoms.  Review of Systems  Constitutional: Negative for fever, chills, activity change, appetite change, fatigue and unexpected weight change.  Eyes: Negative for visual disturbance.  Respiratory: Negative for cough and shortness of breath.   Cardiovascular: Negative for chest pain, palpitations and leg swelling.  Gastrointestinal: Negative for abdominal pain and abdominal distention.  Genitourinary: Negative for dysuria, urgency and difficulty urinating.  Musculoskeletal: Positive for arthralgias and myalgias. Negative for gait problem.  Skin: Positive for color change and rash. Negative for pallor.  Hematological: Negative for adenopathy.  Psychiatric/Behavioral: Negative for sleep disturbance and dysphoric mood. The patient is not nervous/anxious.        Objective:    BP 102/74  Pulse 75  Temp(Src) 98.1 F (36.7 C) (Oral)  Ht 5\' 8"  (1.727 m)  Wt 202 lb 8 oz (91.853 kg)  BMI 30.80 kg/m2  SpO2 98% Physical Exam  Constitutional: He is oriented to person, place, and time. He appears well-developed and well-nourished. No distress.  HENT:  Head: Normocephalic and atraumatic.  Right Ear: External ear normal.  Left Ear: External ear normal.  Nose: Nose normal.  Mouth/Throat: Oropharynx is clear and moist. No oropharyngeal exudate.  Eyes: Conjunctivae and EOM are normal. Pupils are equal, round, and reactive to light. Right eye exhibits no discharge. Left eye exhibits no  discharge. No scleral icterus.  Neck: Normal range of motion. Neck supple. No tracheal deviation present. No thyromegaly present.  Cardiovascular: Normal rate, regular rhythm and normal heart sounds.  Exam reveals no gallop and no friction rub.   No murmur heard. Pulmonary/Chest: Effort normal and breath sounds normal. No accessory muscle usage. Not tachypneic. No respiratory distress. He has no decreased breath sounds. He has no wheezes. He has no rhonchi. He has no rales. He exhibits no tenderness.  Musculoskeletal: Normal range of motion. He exhibits no edema.       Hands: Lymphadenopathy:    He has no cervical adenopathy.  Neurological: He is alert and oriented to person, place, and time. No cranial nerve deficit. Coordination normal.  Skin: Skin is warm and dry. Rash noted. He is not diaphoretic. There is erythema. No pallor.     Psychiatric: He has a normal mood and affect. His behavior is normal. Judgment and thought content normal.          Assessment & Plan:   Problem List Items Addressed This Visit     Unprioritized   Crush injury to thumb     Will request records from [facility]. Continue Percocet prn pain. Follow up with ortho as scheduled.    Tinea versicolor - Primary     Exam today seems more consistent with tinea versicolor, which may be why rash did not respond to griseofulvin. Will start Fluconazole 300mg  po weekly x 2 weeks. Follow up with Dermatology if no improvement.    Relevant Orders      Ambulatory referral to Dermatology       Return in about 4  weeks (around 03/17/2014).

## 2014-02-17 NOTE — Patient Instructions (Signed)
We will set up an appointment with Dermatology.  Follow up in 4 weeks.

## 2014-02-18 ENCOUNTER — Encounter: Payer: Self-pay | Admitting: Internal Medicine

## 2014-02-18 MED ORDER — OXYCODONE-ACETAMINOPHEN 5-325 MG PO TABS: 1.0000 | ORAL_TABLET | Freq: Three times a day (TID) | ORAL | Status: DC | PRN

## 2014-03-18 ENCOUNTER — Encounter: Payer: Self-pay | Admitting: Internal Medicine

## 2014-03-18 ENCOUNTER — Ambulatory Visit (INDEPENDENT_AMBULATORY_CARE_PROVIDER_SITE_OTHER): Payer: Federal, State, Local not specified - PPO | Admitting: Internal Medicine

## 2014-03-18 VITALS — BP 120/80 | HR 79 | Temp 98.1°F | Ht 68.0 in | Wt 206.8 lb

## 2014-03-18 DIAGNOSIS — Z23 Encounter for immunization: Secondary | ICD-10-CM

## 2014-03-18 DIAGNOSIS — Z5189 Encounter for other specified aftercare: Secondary | ICD-10-CM

## 2014-03-18 DIAGNOSIS — S6710XA Crushing injury of unspecified finger(s), initial encounter: Secondary | ICD-10-CM

## 2014-03-18 DIAGNOSIS — M25519 Pain in unspecified shoulder: Secondary | ICD-10-CM | POA: Insufficient documentation

## 2014-03-18 DIAGNOSIS — E669 Obesity, unspecified: Secondary | ICD-10-CM

## 2014-03-18 DIAGNOSIS — B360 Pityriasis versicolor: Secondary | ICD-10-CM

## 2014-03-18 DIAGNOSIS — S6702XD Crushing injury of left thumb, subsequent encounter: Secondary | ICD-10-CM

## 2014-03-18 NOTE — Progress Notes (Signed)
Pre visit review using our clinic review tool, if applicable. No additional management support is needed unless otherwise documented below in the visit note. 

## 2014-03-18 NOTE — Assessment & Plan Note (Signed)
Bilateral shoulder pain most consistent with OA. Will start prn Ibuprofen  tid prn for pain. If pain persistent, we discussed referral to sports medicine for possible steroid injection.

## 2014-03-18 NOTE — Assessment & Plan Note (Signed)
Wound appears to be healing well with pink granulation tissue present. Will continue to monitor.

## 2014-03-18 NOTE — Progress Notes (Signed)
Subjective:    Patient ID: Gabriel Dominguez., male    DOB: 11-22-1980, 33 y.o.   MRN: [id number]  HPI 33YO male presents to follow up recent tinea versicolor. Treated with Fluconazole. Rash is completely resolved.  Has not been taking Phentermine recently as on other medications with finger injury. Plans to start back on medication.  Has some occasional bilateral shoulder pain over last several months. Described as aching, with occasional "popping" sound. Does not occur every day. Made worse by activity such as dirt-biking. Not taking anything for this.  Review of Systems  Constitutional: Negative for fever, chills, activity change, appetite change, fatigue and unexpected weight change.  Eyes: Negative for visual disturbance.  Respiratory: Negative for cough and shortness of breath.   Cardiovascular: Negative for chest pain, palpitations and leg swelling.  Gastrointestinal: Negative for nausea, vomiting, abdominal pain, diarrhea, constipation and abdominal distention.  Genitourinary: Negative for dysuria, urgency and difficulty urinating.  Musculoskeletal: Positive for arthralgias. Negative for gait problem and myalgias.  Skin: Negative for color change and rash.  Hematological: Negative for adenopathy.  Psychiatric/Behavioral: Negative for sleep disturbance and dysphoric mood. The patient is not nervous/anxious.        Objective:    BP 120/80  Pulse 79  Temp(Src) 98.1 F (36.7 C) (Oral)  Ht  (1.727 m)  Wt 206 lb 12 oz (93.781 kg)  BMI 31.44 kg/m2  SpO2 97% Physical Exam  Constitutional: He is oriented to person, place, and time. He appears well-developed and well-nourished. No distress.  HENT:  Head: Normocephalic and atraumatic.  Right Ear: External ear normal.  Left Ear: External ear normal.  Nose: Nose normal.  Mouth/Throat: Oropharynx is clear and moist. No oropharyngeal exudate.  Eyes: Conjunctivae and EOM are normal. Pupils are equal, round, and reactive to  light. Right eye exhibits no discharge. Left eye exhibits no discharge. No scleral icterus.  Neck: Normal range of motion. Neck supple. No tracheal deviation present. No thyromegaly present.  Cardiovascular: Normal rate, regular rhythm and normal heart sounds.  Exam reveals no gallop and no friction rub.   No murmur heard. Pulmonary/Chest: Effort normal and breath sounds normal. No accessory muscle usage. Not tachypneic. No respiratory distress. He has no decreased breath sounds. He has no wheezes. He has no rhonchi. He has no rales. He exhibits no tenderness.  Musculoskeletal: Normal range of motion. He exhibits no edema.       Right shoulder: He exhibits normal range of motion and no tenderness.       Left shoulder: He exhibits normal range of motion and no tenderness.  Crepitus noted bilateral shoulders.  Lymphadenopathy:    He has no cervical adenopathy.  Neurological: He is alert and oriented to person, place, and time. No cranial nerve deficit. Coordination normal.  Skin: Skin is warm and dry. No rash noted. He is not diaphoretic. No erythema. No pallor.     Psychiatric: He has a normal mood and affect. His behavior is normal. Judgment and thought content normal.          Assessment & Plan:   Problem List Items Addressed This Visit     Unprioritized   Crush injury to thumb     Wound appears to be healing well with pink granulation tissue present. Will continue to monitor.    Obesity (BMI 30-39.9)      Wt Readings from Last 3 Encounters:  03/18/14 206 lb 12 oz (93.781 kg)  02/17/14 202 lb 8 oz (  91.853 kg)  01/13/14 196 lb 8 oz (89.132 kg)   Body mass index is 31.44 kg/(m^2). Encouraged continued healthy diet and exercise. Pt will resume phentermine for appetite suppression. Follow up 3 months and prn.    Pain in joint, shoulder region     Bilateral shoulder pain most consistent with OA. Will start prn Ibuprofen  tid prn for pain. If pain persistent, we discussed  referral to sports medicine for possible steroid injection.    Tinea versicolor - Primary     Rash completely resolved with Fluconazole. Will monitor for recurrence.     Other Visit Diagnoses   Need for prophylactic vaccination and inoculation against influenza        Relevant Orders       Flu Vaccine QUAD 36+ mos PF IM (Fluarix Quad PF) (Completed)        Return in about 4 months (around 07/18/2014) for Physical.

## 2014-03-18 NOTE — Assessment & Plan Note (Signed)
Rash completely resolved with Fluconazole. Will monitor for recurrence.

## 2014-03-18 NOTE — Assessment & Plan Note (Signed)
Wt Readings from Last 3 Encounters:  03/18/14 206 lb 12 oz (93.781 kg)  02/17/14 202 lb 8 oz (91.853 kg)  01/13/14 196 lb 8 oz (89.132 kg)   Body mass index is 31.44 kg/(m^2). Encouraged continued healthy diet and exercise. Pt will resume phentermine for appetite suppression. Follow up 3 months and prn.

## 2014-07-14 ENCOUNTER — Ambulatory Visit (INDEPENDENT_AMBULATORY_CARE_PROVIDER_SITE_OTHER): Payer: Federal, State, Local not specified - PPO | Admitting: Internal Medicine

## 2014-07-14 ENCOUNTER — Encounter: Payer: Self-pay | Admitting: Internal Medicine

## 2014-07-14 VITALS — BP 119/79 | HR 80 | Temp 98.4°F | Ht 68.25 in | Wt 232.5 lb

## 2014-07-14 DIAGNOSIS — L03012 Cellulitis of left finger: Secondary | ICD-10-CM

## 2014-07-14 DIAGNOSIS — Z0000 Encounter for general adult medical examination without abnormal findings: Secondary | ICD-10-CM

## 2014-07-14 DIAGNOSIS — IMO0002 Reserved for concepts with insufficient information to code with codable children: Secondary | ICD-10-CM | POA: Insufficient documentation

## 2014-07-14 DIAGNOSIS — E669 Obesity, unspecified: Secondary | ICD-10-CM

## 2014-07-14 LAB — COMPREHENSIVE METABOLIC PANEL
ALT: 52 U/L (ref 0–53)
AST: 46 U/L — ABNORMAL HIGH (ref 0–37)
Albumin: 4.3 g/dL (ref 3.5–5.2)
Alkaline Phosphatase: 43 U/L (ref 39–117)
BILIRUBIN TOTAL: 0.5 mg/dL (ref 0.2–1.2)
BUN: 14 mg/dL (ref 6–23)
CALCIUM: 9.4 mg/dL (ref 8.4–10.5)
CHLORIDE: 106 meq/L (ref 96–112)
CO2: 27 mEq/L (ref 19–32)
Creatinine, Ser: 0.9 mg/dL (ref 0.4–1.5)
GFR: 96.54 mL/min (ref >60.00)
Glucose, Bld: 90 mg/dL (ref 70–99)
POTASSIUM: 4.5 meq/L (ref 3.5–5.1)
Sodium: 140 mEq/L (ref 135–145)
Total Protein: 7 g/dL (ref 6.0–8.3)

## 2014-07-14 LAB — CBC WITH DIFFERENTIAL/PLATELET
BASOS PCT: 0.7 % (ref 0.0–3.0)
Basophils Absolute: 0 10*3/uL (ref 0.0–0.1)
Eosinophils Absolute: 0.5 10*3/uL (ref 0.0–0.7)
Eosinophils Relative: 8.7 % — ABNORMAL HIGH (ref 0.0–5.0)
HCT: 44.4 % (ref 39.0–52.0)
HEMOGLOBIN: 15 g/dL (ref 13.0–17.0)
LYMPHS PCT: 22.9 % (ref 12.0–46.0)
Lymphs Abs: 1.2 10*3/uL (ref 0.7–4.0)
MCHC: 33.7 g/dL (ref 30.0–36.0)
MCV: 91.8 fl (ref 78.0–100.0)
MONOS PCT: 17.7 % — AB (ref 3.0–12.0)
Monocytes Absolute: 1 10*3/uL (ref 0.1–1.0)
NEUTROS PCT: 50 % (ref 43.0–77.0)
Neutro Abs: 2.7 10*3/uL (ref 1.4–7.7)
Platelets: 220 10*3/uL (ref 150.0–400.0)
RBC: 4.84 Mil/uL (ref 4.22–5.81)
RDW: 12.5 % (ref 11.5–15.5)
WBC: 5.4 10*3/uL (ref 4.0–10.5)

## 2014-07-14 LAB — LIPID PANEL
Cholesterol: 196 mg/dL (ref 0–200)
HDL: 50.5 mg/dL (ref >39.00)
LDL Cholesterol: 121 mg/dL — ABNORMAL HIGH (ref 0–99)
NONHDL: 145.5
Total CHOL/HDL Ratio: 4
Triglycerides: 125 mg/dL (ref 0.0–149.0)
VLDL: 25 mg/dL (ref 0.0–40.0)

## 2014-07-14 LAB — MICROALBUMIN / CREATININE URINE RATIO
Creatinine,U: 57.6 mg/dL
MICROALB UR: 0.2 mg/dL (ref 0.0–1.9)
Microalb Creat Ratio: 0.3 mg/g (ref 0.0–30.0)

## 2014-07-14 LAB — HEMOGLOBIN A1C: Hgb A1c MFr Bld: 5.5 % (ref 4.6–6.5)

## 2014-07-14 LAB — TSH: TSH: 0.72 u[IU]/mL (ref 0.35–4.50)

## 2014-07-14 LAB — VITAMIN D 25 HYDROXY (VIT D DEFICIENCY, FRACTURES): VITD: 14.06 ng/mL — AB (ref 30.00–100.00)

## 2014-07-14 MED ORDER — AMOXICILLIN-POT CLAVULANATE 875-125 MG PO TABS: 1.0000 | ORAL_TABLET | Freq: Two times a day (BID) | ORAL | Status: DC

## 2014-07-14 MED ORDER — PHENTERMINE HCL 37.5 MG PO CAPS: 37.5000 mg | ORAL_CAPSULE | ORAL | Status: DC

## 2014-07-14 NOTE — Assessment & Plan Note (Signed)
Will start Augmentin. Will set up dermatology evaluation given chronic ingrown nail at this site, likely leading to infection.

## 2014-07-14 NOTE — Patient Instructions (Signed)

## 2014-07-14 NOTE — Assessment & Plan Note (Signed)
General medical exam normal today except as noted. Immunizations are UTD. Will check labs including CBC, CMP, lipids, A1c, TSH, Vit D. Encouraged healthy diet and exercise.

## 2014-07-14 NOTE — Progress Notes (Signed)
Subjective:    Patient ID: Gabriel Buntingommy J Cummiskey Jr., male    DOB: 1981/07/16, 33 y.o.   MRN: [id number]  HPI 33YO male presents for physical exam.  Generally feeling well. Only concern today is left thumb pain and redness at site of previous crush injury. No fever, chills, drainage.  Stopped taking Phentermine a few months ago. Not following any specific diet or exercise program. Would like to get back on phentermine.   Wt Readings from Last 3 Encounters:  07/14/14 232 lb 8 oz (105.461 kg)  03/18/14 206 lb 12 oz (93.781 kg)  02/17/14 202 lb 8 oz (91.853 kg)       Past medical, surgical, family and social history per today's encounter.  Review of Systems  Constitutional: Negative for fever, chills, activity change, appetite change, fatigue and unexpected weight change.  Eyes: Negative for visual disturbance.  Respiratory: Negative for cough and shortness of breath.   Cardiovascular: Negative for chest pain, palpitations and leg swelling.  Gastrointestinal: Negative for nausea, vomiting, abdominal pain, diarrhea, constipation and abdominal distention.  Genitourinary: Negative for dysuria, urgency and difficulty urinating.  Musculoskeletal: Negative for arthralgias and gait problem.  Skin: Positive for color change and wound. Negative for rash.  Hematological: Negative for adenopathy.  Psychiatric/Behavioral: Negative for sleep disturbance and dysphoric mood. The patient is not nervous/anxious.        Objective:    BP 119/79 mmHg  Pulse 80  Temp(Src) 98.4 F (36.9 C) (Oral)  Ht 5' 8.25" (1.734 m)  Wt 232 lb 8 oz (105.461 kg)  BMI 35.07 kg/m2  SpO2 98% Physical Exam  Constitutional: He is oriented to person, place, and time. He appears well-developed and well-nourished. No distress.  HENT:  Head: Normocephalic and atraumatic.  Right Ear: External ear normal.  Left Ear: External ear normal.  Nose: Nose normal.  Mouth/Throat: Oropharynx is clear and moist. No oropharyngeal  exudate.  Eyes: Conjunctivae and EOM are normal. Pupils are equal, round, and reactive to light. Right eye exhibits no discharge. Left eye exhibits no discharge. No scleral icterus.  Neck: Normal range of motion. Neck supple. No tracheal deviation present. No thyromegaly present.  Cardiovascular: Normal rate, regular rhythm and normal heart sounds.  Exam reveals no gallop and no friction rub.   No murmur heard. Pulmonary/Chest: Effort normal and breath sounds normal. No accessory muscle usage. No tachypnea. No respiratory distress. He has no decreased breath sounds. He has no wheezes. He has no rhonchi. He has no rales. He exhibits no tenderness.  Abdominal: Soft. Bowel sounds are normal. He exhibits no distension and no mass. There is no tenderness. There is no rebound and no guarding.  Musculoskeletal: Normal range of motion. He exhibits no edema.  Lymphadenopathy:    He has no cervical adenopathy.  Neurological: He is alert and oriented to person, place, and time. No cranial nerve deficit. Coordination normal.  Skin: Skin is warm and dry. No rash noted. He is not diaphoretic. There is erythema. No pallor.     Psychiatric: He has a normal mood and affect. His behavior is normal. Judgment and thought content normal.          Assessment & Plan:   Problem List Items Addressed This Visit      Unprioritized   Obesity (BMI 30-39.9)    Wt Readings from Last 3 Encounters:  07/14/14 232 lb 8 oz (105.461 kg)  03/18/14 206 lb 12 oz (93.781 kg)  02/17/14 202 lb 8 oz (91.853 kg)  Body mass index is 35.07 kg/(m^2). Encouraged healthy diet and exercise. Restart phentermine to help with appetite suppression. Follow up in 4 weeks and prn.    Relevant Medications      phentermine capsule   Paronychia    Will start Augmentin. Will set up dermatology evaluation given chronic ingrown nail at this site, likely leading to infection.    Relevant Medications      AMOXICILLIN-POT CLAVULANATE  875-125 MG PO TABS   Other Relevant Orders      Ambulatory referral to Dermatology   Routine general medical examination at a health care facility - Primary    General medical exam normal today except as noted. Immunizations are UTD. Will check labs including CBC, CMP, lipids, A1c, TSH, Vit D. Encouraged healthy diet and exercise.    Relevant Orders      CBC with Differential      Comprehensive metabolic panel      Lipid panel      Microalbumin / creatinine urine ratio      Vit D  25 hydroxy (rtn osteoporosis monitoring)      TSH      Hemoglobin A1c       Return in about 4 weeks (around 08/11/2014) for Recheck.

## 2014-07-14 NOTE — Assessment & Plan Note (Signed)
Wt Readings from Last 3 Encounters:  07/14/14 232 lb 8 oz (105.461 kg)  03/18/14 206 lb 12 oz (93.781 kg)  02/17/14 202 lb 8 oz (91.853 kg)   Body mass index is 35.07 kg/(m^2). Encouraged healthy diet and exercise. Restart phentermine to help with appetite suppression. Follow up in 4 weeks and prn.

## 2014-07-14 NOTE — Progress Notes (Signed)
Pre visit review using our clinic review tool, if applicable. No additional management support is needed unless otherwise documented below in the visit note. 

## 2014-08-12 ENCOUNTER — Ambulatory Visit (INDEPENDENT_AMBULATORY_CARE_PROVIDER_SITE_OTHER): Payer: Federal, State, Local not specified - PPO | Admitting: Internal Medicine

## 2014-08-12 ENCOUNTER — Encounter: Payer: Self-pay | Admitting: Internal Medicine

## 2014-08-12 VITALS — BP 107/74 | HR 85 | Temp 98.8°F | Ht 68.25 in | Wt 221.5 lb

## 2014-08-12 DIAGNOSIS — L03012 Cellulitis of left finger: Secondary | ICD-10-CM

## 2014-08-12 DIAGNOSIS — R7989 Other specified abnormal findings of blood chemistry: Secondary | ICD-10-CM | POA: Insufficient documentation

## 2014-08-12 DIAGNOSIS — R945 Abnormal results of liver function studies: Secondary | ICD-10-CM

## 2014-08-12 DIAGNOSIS — E669 Obesity, unspecified: Secondary | ICD-10-CM

## 2014-08-12 LAB — HEPATIC FUNCTION PANEL
ALBUMIN: 4.7 g/dL (ref 3.5–5.2)
ALK PHOS: 42 U/L (ref 39–117)
ALT: 21 U/L (ref 0–53)
AST: 22 U/L (ref 0–37)
BILIRUBIN TOTAL: 0.7 mg/dL (ref 0.2–1.2)
Bilirubin, Direct: 0.1 mg/dL (ref 0.0–0.3)
TOTAL PROTEIN: 7.3 g/dL (ref 6.0–8.3)

## 2014-08-12 NOTE — Assessment & Plan Note (Signed)
Wt Readings from Last 3 Encounters:  08/12/14 221 lb 8 oz (100.472 kg)  07/14/14 232 lb 8 oz (105.461 kg)  03/18/14 206 lb 12 oz (93.781 kg)   Body mass index is 33.42 kg/(m^2). Congratulated pt on weight loss. Encouraged continued healthy diet and exercise. Continue Phentermine. Follow up 3 months.

## 2014-08-12 NOTE — Progress Notes (Signed)
Subjective:    Patient ID: Gabriel Buntingommy J Donatelli Jr., male    DOB: [birth year], 34 y.o.   MRN: [id number]  HPI  34YO male presents for follow up.  Last seen for physical exam 12/29. Started back on Phentermine.  Following a healthy diet. Staying active physically at work.  Wt Readings from Last 3 Encounters:  08/12/14 221 lb 8 oz (100.472 kg)  07/14/14 232 lb 8 oz (105.461 kg)  03/18/14 206 lb 12 oz (93.781 kg)   No change to erythema around left thumbnail. Dermatology appt pending.  Past medical, surgical, family and social history per today's encounter.  Review of Systems  Constitutional: Negative for fever, chills, activity change, appetite change, fatigue and unexpected weight change.  Eyes: Negative for visual disturbance.  Respiratory: Negative for cough and shortness of breath.   Cardiovascular: Negative for chest pain, palpitations and leg swelling.  Gastrointestinal: Negative for abdominal pain and abdominal distention.  Genitourinary: Negative for dysuria, urgency and difficulty urinating.  Musculoskeletal: Negative for arthralgias and gait problem.  Skin: Negative for color change and rash.  Hematological: Negative for adenopathy.  Psychiatric/Behavioral: Negative for sleep disturbance and dysphoric mood. The patient is not nervous/anxious.        Objective:    BP 107/74 mmHg  Pulse 85  Temp(Src) 98.8 F (37.1 C) (Oral)  Ht 5' 8.25" (1.734 m)  Wt 221 lb 8 oz (100.472 kg)  BMI 33.42 kg/m2  SpO2 98% Physical Exam  Constitutional: He is oriented to person, place, and time. He appears well-developed and well-nourished. No distress.  HENT:  Head: Normocephalic and atraumatic.  Right Ear: External ear normal.  Left Ear: External ear normal.  Nose: Nose normal.  Mouth/Throat: Oropharynx is clear and moist. No oropharyngeal exudate.  Eyes: Conjunctivae and EOM are normal. Pupils are equal, round, and reactive to light. Right eye exhibits no discharge. Left eye  exhibits no discharge. No scleral icterus.  Neck: Normal range of motion. Neck supple. No tracheal deviation present. No thyromegaly present.  Cardiovascular: Normal rate, regular rhythm and normal heart sounds.  Exam reveals no gallop and no friction rub.   No murmur heard. Pulmonary/Chest: Effort normal and breath sounds normal. No accessory muscle usage. No tachypnea. No respiratory distress. He has no decreased breath sounds. He has no wheezes. He has no rhonchi. He has no rales. He exhibits no tenderness.  Musculoskeletal: Normal range of motion. He exhibits no edema.  Lymphadenopathy:    He has no cervical adenopathy.  Neurological: He is alert and oriented to person, place, and time. No cranial nerve deficit. Coordination normal.  Skin: Skin is warm and dry. No rash noted. He is not diaphoretic. No erythema. No pallor.     Psychiatric: He has a normal mood and affect. His behavior is normal. Judgment and thought content normal.          Assessment & Plan:   Problem List Items Addressed This Visit      Unprioritized   Elevated LFTs    AST 46 on recent labs. Will recheck today.      Relevant Orders   Hepatic function panel   Obesity (BMI 30-39.9) - Primary    Wt Readings from Last 3 Encounters:  08/12/14 221 lb 8 oz (100.472 kg)  07/14/14 232 lb 8 oz (105.461 kg)  03/18/14 206 lb 12 oz (93.781 kg)   Body mass index is 33.42 kg/(m^2). Congratulated pt on weight loss. Encouraged continued healthy diet and exercise. Continue Phentermine. Follow  up 3 months.      Paronychia    Dermatology appt pending.          Return in about 3 months (around 11/11/2014) for Recheck.

## 2014-08-12 NOTE — Assessment & Plan Note (Signed)
AST 46 on recent labs. Will recheck today.

## 2014-08-12 NOTE — Progress Notes (Signed)
Pre visit review using our clinic review tool, if applicable. No additional management support is needed unless otherwise documented below in the visit note. 

## 2014-08-12 NOTE — Assessment & Plan Note (Signed)
Dermatology appt pending.

## 2014-08-12 NOTE — Patient Instructions (Signed)
Continue Phentermine.   Follow up in 3 months or sooner as needed.

## 2014-09-17 ENCOUNTER — Encounter: Payer: Self-pay | Admitting: Internal Medicine

## 2014-11-11 ENCOUNTER — Encounter: Payer: Self-pay | Admitting: Internal Medicine

## 2014-11-11 ENCOUNTER — Ambulatory Visit (INDEPENDENT_AMBULATORY_CARE_PROVIDER_SITE_OTHER): Payer: Federal, State, Local not specified - PPO | Admitting: Internal Medicine

## 2014-11-11 VITALS — BP 109/74 | HR 83 | Temp 98.0°F | Ht 68.25 in | Wt 221.2 lb

## 2014-11-11 DIAGNOSIS — E669 Obesity, unspecified: Secondary | ICD-10-CM | POA: Diagnosis not present

## 2014-11-11 DIAGNOSIS — M25519 Pain in unspecified shoulder: Secondary | ICD-10-CM | POA: Diagnosis not present

## 2014-11-11 DIAGNOSIS — B360 Pityriasis versicolor: Secondary | ICD-10-CM

## 2014-11-11 MED ORDER — TRAMADOL HCL 50 MG PO TABS: 50.0000 mg | ORAL_TABLET | Freq: Three times a day (TID) | ORAL | Status: DC | PRN

## 2014-11-11 MED ORDER — FLUCONAZOLE 150 MG PO TABS: ORAL_TABLET | ORAL | Status: DC

## 2014-11-11 MED ORDER — PHENTERMINE HCL 37.5 MG PO CAPS: 37.5000 mg | ORAL_CAPSULE | ORAL | Status: DC

## 2014-11-11 NOTE — Assessment & Plan Note (Signed)
Rash c/w tinea versicolor. Will repeat course of Diflucan.

## 2014-11-11 NOTE — Progress Notes (Signed)
Pre visit review using our clinic review tool, if applicable. No additional management support is needed unless otherwise documented below in the visit note. 

## 2014-11-11 NOTE — Assessment & Plan Note (Signed)
Wt Readings from Last 3 Encounters:  11/11/14 221 lb 4 oz (100.358 kg)  08/12/14 221 lb 8 oz (100.472 kg)  07/14/14 232 lb 8 oz (105.461 kg)   Body mass index is 33.38 kg/(m^2).  Encouraged continued healthy diet and exercise. Continue phentermine to help with appetite suppression.

## 2014-11-11 NOTE — Patient Instructions (Addendum)
Consider reading the book "Always Hungry" by Dr. Lawanda CousinsLudwig  Use Tramadol as needed for severe shoulder pain. Continue Ibuprofen 800mg  three times daily for pain.  We will set up Sports Medicine evaluation.

## 2014-11-11 NOTE — Assessment & Plan Note (Signed)
Bilateral shoulder pain. Crepitus on exam. Suspect OA. Will set up sports med evaluation.

## 2014-11-11 NOTE — Progress Notes (Signed)
Subjective:    Patient ID: Gabriel Buntingommy J Bartl Jr., male    DOB: 1980/08/31, 34 y.o.   MRN: [id number]  HPI  34YO male presents for followup.  Feeling well. Following healthy diet. Participating in healthy diet at work.  Taking phentermine which has helped with appetite.  Wt Readings from Last 3 Encounters:  11/11/14 221 lb 4 oz (100.358 kg)  08/12/14 221 lb 8 oz (100.472 kg)  07/14/14 232 lb 8 oz (105.461 kg)   Continues to have bilateral shoulder pain. Aching. Sometimes wakes him at night. Taking Ibuprofen on occasion with some improvement. Notes "popping" sounds in bilateral shoulders.  Also notes that rash has returned, similar to last year. Diffuse erythematous. Not itchy.  Past medical, surgical, family and social history per today's encounter.  Review of Systems  Constitutional: Negative for fever, chills, activity change, appetite change, fatigue and unexpected weight change.  Eyes: Negative for visual disturbance.  Respiratory: Negative for cough and shortness of breath.   Cardiovascular: Negative for chest pain, palpitations and leg swelling.  Gastrointestinal: Negative for abdominal pain, diarrhea, constipation and abdominal distention.  Genitourinary: Negative for dysuria, urgency and difficulty urinating.  Musculoskeletal: Positive for arthralgias. Negative for gait problem.  Skin: Positive for color change and rash.  Hematological: Negative for adenopathy.  Psychiatric/Behavioral: Negative for sleep disturbance and dysphoric mood. The patient is not nervous/anxious.        Objective:    BP 109/74 mmHg  Pulse 83  Temp(Src) 98 F (36.7 C) (Oral)  Ht 5' 8.25" (1.734 m)  Wt 221 lb 4 oz (100.358 kg)  BMI 33.38 kg/m2  SpO2 98% Physical Exam  Constitutional: He is oriented to person, place, and time. He appears well-developed and well-nourished. No distress.  HENT:  Head: Normocephalic and atraumatic.  Right Ear: External ear normal.  Left Ear: External ear  normal.  Nose: Nose normal.  Mouth/Throat: Oropharynx is clear and moist. No oropharyngeal exudate.  Eyes: Conjunctivae and EOM are normal. Pupils are equal, round, and reactive to light. Right eye exhibits no discharge. Left eye exhibits no discharge. No scleral icterus.  Neck: Normal range of motion. Neck supple. No tracheal deviation present. No thyromegaly present.  Cardiovascular: Normal rate, regular rhythm and normal heart sounds.  Exam reveals no gallop and no friction rub.   No murmur heard. Pulmonary/Chest: Effort normal and breath sounds normal. No accessory muscle usage. No tachypnea. No respiratory distress. He has no decreased breath sounds. He has no wheezes. He has no rhonchi. He has no rales. He exhibits no tenderness.  Musculoskeletal: Normal range of motion. He exhibits no edema.       Right shoulder: He exhibits crepitus and pain. He exhibits normal range of motion and no tenderness.       Left shoulder: He exhibits crepitus and pain. He exhibits normal range of motion, no tenderness and normal strength.  Lymphadenopathy:    He has no cervical adenopathy.  Neurological: He is alert and oriented to person, place, and time. No cranial nerve deficit. Coordination normal.  Skin: Skin is warm and dry. Rash noted. Rash is maculopapular (erythematous diffuse over trunk). He is not diaphoretic. No erythema. No pallor.  Psychiatric: He has a normal mood and affect. His behavior is normal. Judgment and thought content normal.          Assessment & Plan:   Problem List Items Addressed This Visit      Unprioritized   Obesity (BMI 30-39.9) - Primary  Wt Readings from Last 3 Encounters:  11/11/14 221 lb 4 oz (100.358 kg)  08/12/14 221 lb 8 oz (100.472 kg)  07/14/14 232 lb 8 oz (105.461 kg)   Body mass index is 33.38 kg/(m^2).  Encouraged continued healthy diet and exercise. Continue phentermine to help with appetite suppression.      Relevant Medications   phentermine  37.5 MG capsule   Pain in joint, shoulder region    Bilateral shoulder pain. Crepitus on exam. Suspect OA. Will set up sports med evaluation.      Relevant Medications   traMADol (ULTRAM) 50 MG tablet   Other Relevant Orders   Ambulatory referral to Sports Medicine   Tinea versicolor    Rash c/w tinea versicolor. Will repeat course of Diflucan.          Return in about 3 months (around 02/10/2015) for Recheck.

## 2014-12-04 ENCOUNTER — Other Ambulatory Visit (INDEPENDENT_AMBULATORY_CARE_PROVIDER_SITE_OTHER): Payer: Federal, State, Local not specified - PPO

## 2014-12-04 ENCOUNTER — Encounter: Payer: Self-pay | Admitting: Family Medicine

## 2014-12-04 ENCOUNTER — Ambulatory Visit (INDEPENDENT_AMBULATORY_CARE_PROVIDER_SITE_OTHER): Payer: Federal, State, Local not specified - PPO | Admitting: Family Medicine

## 2014-12-04 VITALS — BP 124/80 | HR 99 | Ht 68.25 in

## 2014-12-04 DIAGNOSIS — M25511 Pain in right shoulder: Secondary | ICD-10-CM

## 2014-12-04 DIAGNOSIS — M25512 Pain in left shoulder: Secondary | ICD-10-CM

## 2014-12-04 DIAGNOSIS — M25519 Pain in unspecified shoulder: Secondary | ICD-10-CM

## 2014-12-04 DIAGNOSIS — M7550 Bursitis of unspecified shoulder: Secondary | ICD-10-CM | POA: Insufficient documentation

## 2014-12-04 MED ORDER — MELOXICAM 15 MG PO TABS: 15.0000 mg | ORAL_TABLET | Freq: Every day | ORAL | Status: DC

## 2014-12-04 NOTE — Patient Instructions (Signed)
Good to see you.  Ice 20 minutes 2 times daily. Usually after activity and before bed. Meloxicam daily for 10 days then as needed pennsaid pinkie amount topically 2 times daily as needed.  Exercises 3-5 times a week.  See me again in 3 week.s

## 2014-12-04 NOTE — Progress Notes (Signed)
Pre visit review using our clinic review tool, if applicable. No additional management support is needed unless otherwise documented below in the visit note. 
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
## 2014-12-04 NOTE — Assessment & Plan Note (Signed)
Pain seems to be bilateral. This seems to be nonspecific. Likely secondary to more muscle imbalances with patient having hypertrophia of the deltoids and mild weakness of the upper back musculature. Patient given exercises to focus on correcting this. We discussed oral anti-inflammatories. Patient's ultrasound showed mild subacromial bursitis that I do think will respond to oral anti-inflammatories. Patient will do and icing protocol. Patient worked with Event organiserathletic trainer to learn home exercises and patient will follow-up with me again in 3 weeks.

## 2014-12-25 ENCOUNTER — Ambulatory Visit: Payer: Federal, State, Local not specified - PPO | Admitting: Family Medicine

## 2015-02-10 ENCOUNTER — Ambulatory Visit: Payer: Federal, State, Local not specified - PPO | Admitting: Internal Medicine

## 2015-08-30 ENCOUNTER — Encounter: Payer: Self-pay | Admitting: Internal Medicine

## 2015-08-30 ENCOUNTER — Telehealth: Payer: Self-pay | Admitting: Family Medicine

## 2015-08-30 ENCOUNTER — Ambulatory Visit (INDEPENDENT_AMBULATORY_CARE_PROVIDER_SITE_OTHER): Payer: Federal, State, Local not specified - PPO | Admitting: Internal Medicine

## 2015-08-30 VITALS — BP 107/72 | HR 77 | Temp 98.4°F | Ht 68.25 in | Wt 249.2 lb

## 2015-08-30 DIAGNOSIS — M25511 Pain in right shoulder: Secondary | ICD-10-CM

## 2015-08-30 MED ORDER — MELOXICAM 15 MG PO TABS: 15.0000 mg | ORAL_TABLET | Freq: Every day | ORAL | Status: DC

## 2015-08-30 MED ORDER — HYDROCODONE-ACETAMINOPHEN 5-325 MG PO TABS: 1.0000 | ORAL_TABLET | Freq: Three times a day (TID) | ORAL | Status: DC | PRN

## 2015-08-30 NOTE — Patient Instructions (Signed)
We will set up an evaluation with Dr. Katrinka Blazing.  Start Meloxicam  daily.  Use Hydrocodone as needed for severe pain. Note that this medication can make you drowsy.

## 2015-08-30 NOTE — Telephone Encounter (Signed)
Dr walkers office is calling to request that patient be worked in asap for right arm/ possible rotator cuff tear. No access until 09/08/2015 at this point.

## 2015-08-30 NOTE — Progress Notes (Signed)
Pre visit review using our clinic review tool, if applicable. No additional management support is needed unless otherwise documented below in the visit note. 

## 2015-08-30 NOTE — Telephone Encounter (Signed)
lmovm offering pt 745am in the morning.

## 2015-08-30 NOTE — Telephone Encounter (Signed)
Spoke with pt, scheduled him for in the morning.
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
## 2015-08-30 NOTE — Assessment & Plan Note (Signed)
Severe right shoulder pain, with crepitus on exam and weakness of shoulder abduction, bicep/tricep flexion/extension, however limited by pain. Question rotator cuff tear. Will set up evaluation with Dr. Katrinka Blazing. Start Meloxicam  daily. Add Hydrocodone for severe pain, particularly at night.

## 2015-08-30 NOTE — Telephone Encounter (Signed)
Lets say in AM tomorrow.   Will try to get out of meeting quickly

## 2015-08-31 ENCOUNTER — Encounter: Payer: Self-pay | Admitting: Family Medicine

## 2015-08-31 ENCOUNTER — Other Ambulatory Visit (INDEPENDENT_AMBULATORY_CARE_PROVIDER_SITE_OTHER): Payer: Federal, State, Local not specified - PPO

## 2015-08-31 ENCOUNTER — Ambulatory Visit (INDEPENDENT_AMBULATORY_CARE_PROVIDER_SITE_OTHER): Payer: Federal, State, Local not specified - PPO | Admitting: Family Medicine

## 2015-08-31 VITALS — BP 126/84 | HR 73 | Ht 68.0 in | Wt 248.0 lb

## 2015-08-31 DIAGNOSIS — M25511 Pain in right shoulder: Secondary | ICD-10-CM | POA: Diagnosis not present

## 2015-08-31 DIAGNOSIS — M75101 Unspecified rotator cuff tear or rupture of right shoulder, not specified as traumatic: Secondary | ICD-10-CM | POA: Insufficient documentation

## 2015-08-31 MED ORDER — DICLOFENAC SODIUM 2 % TD SOLN: 1.0000 "application " | Freq: Two times a day (BID) | TRANSDERMAL | Status: DC

## 2015-08-31 NOTE — Progress Notes (Signed)
Pre visit review using our clinic review tool, if applicable. No additional management support is needed unless otherwise documented below in the visit note. 

## 2015-08-31 NOTE — Patient Instructions (Signed)
Good to see you  Ice 20 minutes 2 times daily. Usually after activity and before bed. Exercises 3 times a week.  pennsaid pinkie amount topically 2 times daily as needed.  We injected you today  Try to avoid heavy lifting and keep hands within peripheral vision  See me again in 3-4 weeks and we will make sure you are improving.
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
## 2015-08-31 NOTE — Assessment & Plan Note (Signed)
Patient does have rotator cuff tear. Seems to be partial-thickness. Responded to the injection. Work with Event organiser to learn home exercises. We discussed icing regimen. Patient given prescription for topical anti-inflammatory. Patient and will try these conservative therapies and come back and see me again in 2-3 weeks to make sure that he is responding appropriately

## 2015-09-13 ENCOUNTER — Encounter: Payer: Self-pay | Admitting: Internal Medicine

## 2015-09-13 ENCOUNTER — Ambulatory Visit (INDEPENDENT_AMBULATORY_CARE_PROVIDER_SITE_OTHER): Payer: Federal, State, Local not specified - PPO | Admitting: Internal Medicine

## 2015-09-13 VITALS — BP 117/80 | HR 80 | Temp 98.2°F | Wt 244.0 lb

## 2015-09-13 DIAGNOSIS — M75101 Unspecified rotator cuff tear or rupture of right shoulder, not specified as traumatic: Secondary | ICD-10-CM | POA: Diagnosis not present

## 2015-09-13 NOTE — Progress Notes (Signed)
Pre visit review using our clinic review tool, if applicable. No additional management support is needed unless otherwise documented below in the visit note. 

## 2015-09-13 NOTE — Assessment & Plan Note (Signed)
Symptoms improved. Will continue Meloxicam and Diclofenac prn. Follow up with Dr. Katrinka Blazing as scheduled next month.
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
## 2015-09-13 NOTE — Patient Instructions (Signed)
Continue current medications. 

## 2015-09-21 ENCOUNTER — Ambulatory Visit (INDEPENDENT_AMBULATORY_CARE_PROVIDER_SITE_OTHER): Payer: Federal, State, Local not specified - PPO | Admitting: Family Medicine

## 2015-09-21 ENCOUNTER — Encounter: Payer: Self-pay | Admitting: Family Medicine

## 2015-09-21 VITALS — BP 126/82 | HR 72 | Wt 239.0 lb

## 2015-09-21 DIAGNOSIS — M75101 Unspecified rotator cuff tear or rupture of right shoulder, not specified as traumatic: Secondary | ICD-10-CM
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
## 2015-09-21 NOTE — Patient Instructions (Signed)
Good to see you  Ice is your friend Exercises still 3 times a week for another 6 weeks.  See me again in 6 weeks if not perfect otherwise see me when you need me.

## 2015-09-21 NOTE — Assessment & Plan Note (Signed)
Patient is doing significantly better at this time. Still question for a possible labral pathology patient does not make any significant improvement. Encourage him to continue the exercises 3 times a week for another 6 weeks. Declined formal physical therapy. Patient will come back and see me again in 6 weeks for further evaluation and treatment.

## 2015-11-02 ENCOUNTER — Ambulatory Visit (INDEPENDENT_AMBULATORY_CARE_PROVIDER_SITE_OTHER): Payer: Federal, State, Local not specified - PPO | Admitting: Family Medicine

## 2015-11-02 ENCOUNTER — Encounter: Payer: Self-pay | Admitting: Family Medicine

## 2015-11-02 VITALS — BP 122/78 | HR 74 | Ht 68.0 in | Wt 240.0 lb

## 2015-11-02 DIAGNOSIS — M75101 Unspecified rotator cuff tear or rupture of right shoulder, not specified as traumatic: Secondary | ICD-10-CM
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
## 2015-11-02 NOTE — Patient Instructions (Signed)
Good to see you  Ice nightly for 10 minutes for the next 10  Nights then as needed COntinue the exercises 2 times a week indefinitely.  See me when you need me.  If it ever worsens we can try an injection again.

## 2015-11-02 NOTE — Assessment & Plan Note (Signed)
Patient is over 2 months out from the injections. Is doing very well. We discussed icing regimen and continuing the home exercises. Has meloxicam for breakthrough pain. Patient will come back and see me as needed.

## 2015-11-02 NOTE — Progress Notes (Signed)
Pre visit review using our clinic review tool, if applicable. No additional management support is needed unless otherwise documented below in the visit note. 

## 2016-03-27 ENCOUNTER — Encounter: Payer: Self-pay | Admitting: Physician Assistant

## 2016-03-27 ENCOUNTER — Emergency Department: Payer: Federal, State, Local not specified - PPO

## 2016-03-27 ENCOUNTER — Ambulatory Visit (INDEPENDENT_AMBULATORY_CARE_PROVIDER_SITE_OTHER)
Admission: EM | Admit: 2016-03-27 | Discharge: 2016-03-27 | Disposition: A | Discharge status: Home / Self Care | Payer: Federal, State, Local not specified - PPO | Source: Home / Self Care | Attending: Internal Medicine | Admitting: Internal Medicine

## 2016-03-27 ENCOUNTER — Observation Stay
Admission: EM | Admit: 2016-03-27 | Discharge: 2016-03-28 | Disposition: A | Discharge status: Home / Self Care | Payer: Federal, State, Local not specified - PPO | Attending: Internal Medicine | Admitting: Internal Medicine

## 2016-03-27 DIAGNOSIS — R0602 Shortness of breath: Secondary | ICD-10-CM

## 2016-03-27 DIAGNOSIS — R21 Rash and other nonspecific skin eruption: Secondary | ICD-10-CM | POA: Diagnosis not present

## 2016-03-27 DIAGNOSIS — R0600 Dyspnea, unspecified: Secondary | ICD-10-CM

## 2016-03-27 DIAGNOSIS — T7840XA Allergy, unspecified, initial encounter: Secondary | ICD-10-CM

## 2016-03-27 DIAGNOSIS — Z79899 Other long term (current) drug therapy: Secondary | ICD-10-CM | POA: Insufficient documentation

## 2016-03-27 DIAGNOSIS — D72829 Elevated white blood cell count, unspecified: Secondary | ICD-10-CM

## 2016-03-27 DIAGNOSIS — Z791 Long term (current) use of non-steroidal anti-inflammatories (NSAID): Secondary | ICD-10-CM | POA: Diagnosis not present

## 2016-03-27 DIAGNOSIS — Z8249 Family history of ischemic heart disease and other diseases of the circulatory system: Secondary | ICD-10-CM | POA: Insufficient documentation

## 2016-03-27 DIAGNOSIS — Z825 Family history of asthma and other chronic lower respiratory diseases: Secondary | ICD-10-CM | POA: Insufficient documentation

## 2016-03-27 DIAGNOSIS — L500 Allergic urticaria: Secondary | ICD-10-CM | POA: Diagnosis not present

## 2016-03-27 LAB — URINALYSIS COMPLETE WITH MICROSCOPIC (ARMC ONLY)
BACTERIA UA: NONE SEEN
BILIRUBIN URINE: NEGATIVE
Glucose, UA: NEGATIVE mg/dL
HGB URINE DIPSTICK: NEGATIVE
LEUKOCYTES UA: NEGATIVE
NITRITE: NEGATIVE
PH: 6 (ref 5.0–8.0)
Protein, ur: NEGATIVE mg/dL
Specific Gravity, Urine: 1.012 (ref 1.005–1.030)
Squamous Epithelial / LPF: NONE SEEN

## 2016-03-27 LAB — CBC WITH DIFFERENTIAL/PLATELET
BASOS ABS: 0 10*3/uL (ref 0–0.1)
Basophils Relative: 0 %
Eosinophils Absolute: 0.1 10*3/uL (ref 0–0.7)
Eosinophils Relative: 1 %
HEMATOCRIT: 46.3 % (ref 40.0–52.0)
HEMOGLOBIN: 16.1 g/dL (ref 13.0–18.0)
LYMPHS PCT: 7 %
Lymphs Abs: 1.3 10*3/uL (ref 1.0–3.6)
MCH: 31.6 pg (ref 26.0–34.0)
MCHC: 34.8 g/dL (ref 32.0–36.0)
MCV: 90.9 fL (ref 80.0–100.0)
Monocytes Absolute: 0.8 10*3/uL (ref 0.2–1.0)
Monocytes Relative: 4 %
NEUTROS ABS: 16.7 10*3/uL — AB (ref 1.4–6.5)
Neutrophils Relative %: 88 %
Platelets: 257 10*3/uL (ref 150–440)
RBC: 5.09 MIL/uL (ref 4.40–5.90)
RDW: 12.4 % (ref 11.5–14.5)
WBC: 18.9 10*3/uL — AB (ref 3.8–10.6)

## 2016-03-27 LAB — COMPREHENSIVE METABOLIC PANEL
ALBUMIN: 4.2 g/dL (ref 3.5–5.0)
ALT: 28 U/L (ref 17–63)
ANION GAP: 7 (ref 5–15)
AST: 29 U/L (ref 15–41)
Alkaline Phosphatase: 35 U/L — ABNORMAL LOW (ref 38–126)
BILIRUBIN TOTAL: 0.6 mg/dL (ref 0.3–1.2)
BUN: 25 mg/dL — ABNORMAL HIGH (ref 6–20)
CO2: 23 mmol/L (ref 22–32)
Calcium: 9.5 mg/dL (ref 8.9–10.3)
Chloride: 102 mmol/L (ref 101–111)
Creatinine, Ser: 1.45 mg/dL — ABNORMAL HIGH (ref 0.61–1.24)
GFR calc Af Amer: >60 mL/min (ref >60)
Glucose, Bld: 115 mg/dL — ABNORMAL HIGH (ref 65–99)
POTASSIUM: 3.3 mmol/L — AB (ref 3.5–5.1)
Sodium: 132 mmol/L — ABNORMAL LOW (ref 135–145)
TOTAL PROTEIN: 6.9 g/dL (ref 6.5–8.1)

## 2016-03-27 LAB — LACTIC ACID, PLASMA: LACTIC ACID, VENOUS: 0.9 mmol/L (ref 0.5–1.9)

## 2016-03-27 LAB — SEDIMENTATION RATE: Sed Rate: 2 mm/hr (ref 0–15)

## 2016-03-27 MED ORDER — DIPHENHYDRAMINE HCL 50 MG/ML IJ SOLN
50.0000 mg | Freq: Once | INTRAMUSCULAR | Status: AC
  Administered 2016-03-27: 50 mg via INTRAMUSCULAR

## 2016-03-27 MED ORDER — DIPHENHYDRAMINE HCL 50 MG PO CAPS: 50.0000 mg | ORAL_CAPSULE | Freq: Once | ORAL | Status: DC

## 2016-03-27 MED ORDER — METHYLPREDNISOLONE SODIUM SUCC 125 MG IJ SOLR
125.0000 mg | Freq: Once | INTRAMUSCULAR | Status: AC
  Administered 2016-03-27: 125 mg via INTRAMUSCULAR

## 2016-03-27 MED ORDER — SODIUM CHLORIDE 0.9 % IV BOLUS (SEPSIS)
1000.0000 mL | Freq: Once | INTRAVENOUS | Status: AC
  Administered 2016-03-27: 1000 mL via INTRAVENOUS

## 2016-03-27 NOTE — ED Triage Notes (Addendum)
Patient arrived by EMS [facility]. Patient was at work welding and symptoms of hives all over body began and tingling in bilateral hands and feet. Patient was [facility] and reports having trouble gripping the steering wheel. Patient received 125solu-medrol and 50mg  benadryl IM at Urgent care. EMS administered 0.3 epi IM and 20mg  pepcid. EMS vitals 102HR and 119/72 blood pressure. Pt is A&O x4 Blood Sugar 135

## 2016-03-27 NOTE — ED Triage Notes (Signed)
Patient states that around 3pm today he started having swelling of his hands and feet. Patient has hives on back of neck and lower abdomen. Patient states that this was a sudden onset. Patient states that he feels some shortness of breath. Patient denies any new food or detergents. Patient states that he has had an episode like this before and has taken benadryl and it improved.
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
## 2016-03-27 NOTE — ED Notes (Signed)
Pt states breathing, swelling, and redness  Improved.  Will continue to monitor.
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
## 2016-03-27 NOTE — ED Notes (Signed)
ADMITTING MD at bedside
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
## 2016-03-28 ENCOUNTER — Telehealth: Payer: Self-pay | Admitting: Internal Medicine

## 2016-03-28 LAB — BASIC METABOLIC PANEL
Anion gap: 5 (ref 5–15)
BUN: 22 mg/dL — AB (ref 6–20)
CALCIUM: 9.5 mg/dL (ref 8.9–10.3)
CO2: 26 mmol/L (ref 22–32)
Chloride: 107 mmol/L (ref 101–111)
Creatinine, Ser: 1.11 mg/dL (ref 0.61–1.24)
GFR calc Af Amer: >60 mL/min (ref >60)
GLUCOSE: 165 mg/dL — AB (ref 65–99)
Potassium: 4.6 mmol/L (ref 3.5–5.1)
Sodium: 138 mmol/L (ref 135–145)

## 2016-03-28 LAB — CBC
HEMATOCRIT: 43.2 % (ref 40.0–52.0)
Hemoglobin: 15.4 g/dL (ref 13.0–18.0)
MCH: 32.5 pg (ref 26.0–34.0)
MCHC: 35.6 g/dL (ref 32.0–36.0)
MCV: 91.3 fL (ref 80.0–100.0)
Platelets: 257 10*3/uL (ref 150–440)
RBC: 4.73 MIL/uL (ref 4.40–5.90)
RDW: 12.5 % (ref 11.5–14.5)
WBC: 6.5 10*3/uL (ref 3.8–10.6)

## 2016-03-28 LAB — MISC LABCORP TEST (SEND OUT): Labcorp test code: 138685

## 2016-03-28 MED ORDER — PREDNISONE 10 MG PO TABS: 10.0000 mg | ORAL_TABLET | Freq: Every day | ORAL | 0 refills | Status: DC

## 2016-03-28 MED ORDER — CETIRIZINE HCL 10 MG PO TABS: 10.0000 mg | ORAL_TABLET | Freq: Every day | ORAL | 0 refills | Status: DC

## 2016-03-28 MED ORDER — EPINEPHRINE 0.3 MG/0.3ML IJ SOAJ: 0.3000 mg | Freq: Once | INTRAMUSCULAR | 0 refills | Status: AC

## 2016-03-28 MED ORDER — SODIUM CHLORIDE 0.9% FLUSH
3.0000 mL | Freq: Two times a day (BID) | INTRAVENOUS | Status: DC
  Administered 2016-03-28 (×2): 3 mL via INTRAVENOUS

## 2016-03-28 MED ORDER — ONDANSETRON HCL 4 MG/2ML IJ SOLN
4.0000 mg | Freq: Four times a day (QID) | INTRAMUSCULAR | Status: DC | PRN
Start: 2016-03-28 — End: 2016-03-28

## 2016-03-28 MED ORDER — ENOXAPARIN SODIUM 40 MG/0.4ML ~~LOC~~ SOLN
40.0000 mg | Freq: Every day | SUBCUTANEOUS | Status: DC
  Filled 2016-03-28: qty 0.4

## 2016-03-28 MED ORDER — ACETAMINOPHEN 650 MG RE SUPP: 650.0000 mg | Freq: Four times a day (QID) | RECTAL | Status: DC | PRN

## 2016-03-28 MED ORDER — HYDROCODONE-ACETAMINOPHEN 5-325 MG PO TABS: 1.0000 | ORAL_TABLET | Freq: Three times a day (TID) | ORAL | Status: DC | PRN

## 2016-03-28 MED ORDER — ONDANSETRON HCL 4 MG PO TABS: 4.0000 mg | ORAL_TABLET | Freq: Four times a day (QID) | ORAL | Status: DC | PRN

## 2016-03-28 MED ORDER — ACETAMINOPHEN 325 MG PO TABS
650.0000 mg | ORAL_TABLET | Freq: Four times a day (QID) | ORAL | Status: DC | PRN
Start: 2016-03-28 — End: 2016-03-28
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
## 2016-03-28 NOTE — Progress Notes (Signed)
Pt. Given Discharge instructions and prescriptions, Ivs removed, pt waiting for wife for transport home Eligah EastErin M Jlyn Bracamonte, RN

## 2016-03-28 NOTE — Telephone Encounter (Signed)
FYI, Pt is being discharged from the hospital. Dx was Allergic reaction. Pt is scheduled. Thank you!

## 2016-03-28 NOTE — Telephone Encounter (Signed)
This visit does not qualify as TCM.  This will be a standard hospital visit.

## 2016-03-28 NOTE — Progress Notes (Signed)
No complaints overnight. No dyspnea during the night. Remaining free from rash.

## 2016-03-28 NOTE — Telephone Encounter (Signed)
Noted, HC will follow

## 2016-03-29 ENCOUNTER — Encounter: Payer: Self-pay | Admitting: Family

## 2016-03-29 DIAGNOSIS — R768 Other specified abnormal immunological findings in serum: Secondary | ICD-10-CM | POA: Insufficient documentation

## 2016-04-01 LAB — CULTURE, BLOOD (ROUTINE X 2)
CULTURE: NO GROWTH
Culture: NO GROWTH

## 2016-04-03 ENCOUNTER — Ambulatory Visit (INDEPENDENT_AMBULATORY_CARE_PROVIDER_SITE_OTHER): Payer: Federal, State, Local not specified - PPO | Admitting: Family

## 2016-04-03 ENCOUNTER — Encounter: Payer: Self-pay | Admitting: Family

## 2016-04-03 ENCOUNTER — Ambulatory Visit
Admission: RE | Admit: 2016-04-03 | Discharge: 2016-04-03 | Disposition: A | Discharge status: Home / Self Care | Payer: Federal, State, Local not specified - PPO | Source: Ambulatory Visit | Attending: Family | Admitting: Family

## 2016-04-03 ENCOUNTER — Ambulatory Visit
Admission: RE | Admit: 2016-04-03 | Payer: Federal, State, Local not specified - PPO | Source: Ambulatory Visit | Admitting: *Deleted

## 2016-04-03 VITALS — BP 106/78 | HR 103 | Temp 98.2°F | Ht 67.0 in | Wt 244.0 lb

## 2016-04-03 DIAGNOSIS — Z23 Encounter for immunization: Secondary | ICD-10-CM

## 2016-04-03 DIAGNOSIS — M79671 Pain in right foot: Secondary | ICD-10-CM | POA: Diagnosis not present

## 2016-04-03 DIAGNOSIS — M7731 Calcaneal spur, right foot: Secondary | ICD-10-CM | POA: Diagnosis not present

## 2016-04-03 DIAGNOSIS — T7840XD Allergy, unspecified, subsequent encounter: Secondary | ICD-10-CM

## 2016-04-03 LAB — CBC WITH DIFFERENTIAL/PLATELET
Basophils Absolute: 0.1 10*3/uL (ref 0.0–0.1)
Basophils Relative: 0.6 % (ref 0.0–3.0)
EOS PCT: 2.9 % (ref 0.0–5.0)
Eosinophils Absolute: 0.3 10*3/uL (ref 0.0–0.7)
HEMATOCRIT: 44.2 % (ref 39.0–52.0)
HEMOGLOBIN: 15.3 g/dL (ref 13.0–17.0)
LYMPHS PCT: 26.2 % (ref 12.0–46.0)
Lymphs Abs: 2.3 10*3/uL (ref 0.7–4.0)
MCHC: 34.5 g/dL (ref 30.0–36.0)
MCV: 91.7 fl (ref 78.0–100.0)
MONOS PCT: 6.3 % (ref 3.0–12.0)
Monocytes Absolute: 0.6 10*3/uL (ref 0.1–1.0)
Neutro Abs: 5.6 10*3/uL (ref 1.4–7.7)
Neutrophils Relative %: 64 % (ref 43.0–77.0)
Platelets: 313 10*3/uL (ref 150.0–400.0)
RBC: 4.82 Mil/uL (ref 4.22–5.81)
RDW: 12.8 % (ref 11.5–15.5)
WBC: 8.8 10*3/uL (ref 4.0–10.5)

## 2016-04-03 LAB — COMPREHENSIVE METABOLIC PANEL
ALBUMIN: 4.3 g/dL (ref 3.5–5.2)
ALK PHOS: 42 U/L (ref 39–117)
ALT: 27 U/L (ref 0–53)
AST: 19 U/L (ref 0–37)
BILIRUBIN TOTAL: 0.5 mg/dL (ref 0.2–1.2)
BUN: 12 mg/dL (ref 6–23)
CO2: 28 mEq/L (ref 19–32)
Calcium: 9.2 mg/dL (ref 8.4–10.5)
Chloride: 104 mEq/L (ref 96–112)
Creatinine, Ser: 1.34 mg/dL (ref 0.40–1.50)
GFR: 63.52 mL/min (ref >60.00)
Glucose, Bld: 82 mg/dL (ref 70–99)
POTASSIUM: 4.1 meq/L (ref 3.5–5.1)
Sodium: 139 mEq/L (ref 135–145)
TOTAL PROTEIN: 6.7 g/dL (ref 6.0–8.3)

## 2016-04-03 LAB — C-REACTIVE PROTEIN: CRP: 0.3 mg/dL — AB (ref 0.5–20.0)

## 2016-04-03 LAB — SEDIMENTATION RATE: Sed Rate: 1 mm/hr (ref 0–15)

## 2016-04-03 LAB — URIC ACID: URIC ACID, SERUM: 6.2 mg/dL (ref 4.0–7.8)

## 2016-04-03 NOTE — Patient Instructions (Signed)
Lets do lyme disease titer in one month to ensure negative.   Please do NOT have the lab drawn with labs today.    Anaphylactic Reaction An anaphylactic reaction is a sudden, severe allergic reaction that involves the whole body. It can be life threatening. A hospital stay is often required. People with asthma, eczema, or hay fever are slightly more likely to have an anaphylactic reaction. CAUSES  An anaphylactic reaction may be caused by anything to which you are allergic. After being exposed to the allergic substance, your immune system becomes sensitized to it. When you are exposed to that allergic substance again, an allergic reaction can occur. Common causes of an anaphylactic reaction include:  Medicines.  Foods, especially peanuts, wheat, shellfish, milk, and eggs.  Insect bites or stings.  Blood products.  Chemicals, such as dyes, latex, and contrast material used for imaging tests. SYMPTOMS  When an allergic reaction occurs, the body releases histamine and other substances. These substances cause symptoms such as tightening of the airway. Symptoms often develop within seconds or minutes of exposure. Symptoms may include:  Skin rash or hives.  Itching.  Chest tightness.  Swelling of the eyes, tongue, or lips.  Trouble breathing or swallowing.  Lightheadedness or fainting.  Anxiety or confusion.  Stomach pains, vomiting, or diarrhea.  Nasal congestion.  A fast or irregular heartbeat (palpitations). DIAGNOSIS  Diagnosis is based on your history of recent exposure to allergic substances, your symptoms, and a physical exam. Your caregiver may also perform blood or urine tests to confirm the diagnosis. TREATMENT  Epinephrine medicine is the main treatment for an anaphylactic reaction. Other medicines that may be used for treatment include antihistamines, steroids, and albuterol. In severe cases, fluids and medicine to support blood pressure may be given through an  intravenous line (IV). Even if you improve after treatment, you need to be observed to make sure your condition does not get worse. This may require a stay in the hospital. [place] a medical alert bracelet or necklace stating your allergy.  You and your family must learn how to use an anaphylaxis kit or give an epinephrine injection to temporarily treat an emergency allergic reaction. Always carry your epinephrine injection or anaphylaxis kit with you. This can be lifesaving if you have a severe reaction.  Do not drive or perform tasks after treatment until the medicines used to treat your reaction have worn off, or until your caregiver says it is okay.  If you have hives or a rash:  Take medicines as directed by your caregiver.  You may use an over-the-counter antihistamine (diphenhydramine) as needed.  Apply cold compresses to the skin or take baths in cool water. Avoid hot baths or showers. SEEK MEDICAL CARE IF:   You develop symptoms of an allergic reaction to a new substance. Symptoms may start right away or minutes later.  You develop a rash, hives, or itching.  You develop new symptoms. SEEK IMMEDIATE MEDICAL CARE IF:   You have swelling of the mouth, difficulty breathing, or wheezing.  You have a tight feeling in your chest or throat.  You develop hives, swelling, or itching all over your body.  You develop severe vomiting or diarrhea.  You feel faint or pass out. This is an emergency. Use your epinephrine injection or anaphylaxis kit as you have been instructed. Call your local emergency services (911 in [place] Even if you improve after the injection, you need to be examined at a  hospital emergency department. MAKE SURE YOU:   Understand these instructions.  Will watch your condition.  Will get help right away if you are not doing well or get worse.   This information is not intended to replace advice given to you by your health care provider.  Make sure you discuss any questions you have with your health care provider.   Document Released: 07/03/2005 Document Revised: 07/08/2013 Document Reviewed: 01/13/2015 Elsevier Interactive Patient Education [employer].
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
## 2016-04-03 NOTE — Assessment & Plan Note (Signed)
Symptoms not consistent with gout. Considering stress fracture or aggravation for steal toe boot. Pending lab work and Personal assistantxray.

## 2016-04-03 NOTE — Assessment & Plan Note (Signed)
Resolved. Still no known trigger. Will repeat hospital lab work to recheck kidney function. Will also repeat lyme titer in one month as it may have been drawn too early. Patient already has EpiPen and is set up with allergist.

## 2016-06-12 ENCOUNTER — Encounter: Payer: Self-pay | Admitting: Family

## 2016-06-12 ENCOUNTER — Encounter: Payer: Federal, State, Local not specified - PPO | Admitting: Family

## 2016-06-12 DIAGNOSIS — Z0289 Encounter for other administrative examinations: Secondary | ICD-10-CM
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
## 2016-06-27 ENCOUNTER — Telehealth: Payer: Self-pay | Admitting: Family

## 2016-06-27 NOTE — Telephone Encounter (Signed)
Call pt-  Reviewing chart and noticed lots of labs from November not done - including fasting physical labs  And even lyme titers ( these were to be repeated from ED) from September when patient had allergic rxn  Please advise pt to make f/u appt if he would like lab work to be done.   I have canceled pended orders for now.

## 2016-06-27 NOTE — Telephone Encounter (Signed)
Left message for patient to return call back.  

## 2016-06-27 NOTE — Progress Notes (Signed)
This encounter was created in error - please disregard.

## 2016-06-30 NOTE — Telephone Encounter (Signed)
Left message for patient to return call back.  

## 2016-07-06 NOTE — Telephone Encounter (Signed)
Closing not until patient returns call

## 2018-07-19 ENCOUNTER — Other Ambulatory Visit: Payer: Self-pay

## 2018-07-19 ENCOUNTER — Ambulatory Visit
Admission: EM | Admit: 2018-07-19 | Discharge: 2018-07-19 | Disposition: A | Discharge status: Home / Self Care | Payer: Federal, State, Local not specified - PPO | Attending: Family | Admitting: Family

## 2018-07-19 DIAGNOSIS — J0100 Acute maxillary sinusitis, unspecified: Secondary | ICD-10-CM | POA: Diagnosis not present

## 2018-07-19 DIAGNOSIS — B9789 Other viral agents as the cause of diseases classified elsewhere: Secondary | ICD-10-CM

## 2018-07-19 DIAGNOSIS — R030 Elevated blood-pressure reading, without diagnosis of hypertension: Secondary | ICD-10-CM | POA: Diagnosis not present

## 2018-07-19 HISTORY — DX: Allergy to other foods: Z91.018

## 2018-07-19 MED ORDER — DOXYCYCLINE HYCLATE 100 MG PO CAPS: 100.0000 mg | ORAL_CAPSULE | Freq: Two times a day (BID) | ORAL | 0 refills | Status: AC
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
## 2018-07-19 NOTE — ED Triage Notes (Signed)
Patient complains of sinus pain and pressure, nasal congestion x 10 days without relief.

## 2018-07-19 NOTE — Discharge Instructions (Addendum)
Recommend start Doxycycline 100mg  twice a day as directed. Continue to increase fluids to help loosen up mucus. May continue Nyquil as needed for cough. Recommend follow-up in 4 to 5 days if not improving.

## 2018-07-29 DIAGNOSIS — J329 Chronic sinusitis, unspecified: Secondary | ICD-10-CM | POA: Diagnosis not present

## 2018-07-29 DIAGNOSIS — J301 Allergic rhinitis due to pollen: Secondary | ICD-10-CM | POA: Diagnosis not present

## 2018-08-26 DIAGNOSIS — J342 Deviated nasal septum: Secondary | ICD-10-CM | POA: Diagnosis not present

## 2018-08-26 DIAGNOSIS — J328 Other chronic sinusitis: Secondary | ICD-10-CM | POA: Diagnosis not present

## 2018-09-23 ENCOUNTER — Encounter: Payer: Self-pay | Admitting: *Deleted

## 2018-09-23 ENCOUNTER — Other Ambulatory Visit: Payer: Self-pay

## 2018-10-02 ENCOUNTER — Ambulatory Visit: Payer: Federal, State, Local not specified - PPO | Admitting: Anesthesiology

## 2018-10-02 ENCOUNTER — Other Ambulatory Visit: Payer: Self-pay

## 2018-10-02 ENCOUNTER — Ambulatory Visit
Admission: RE | Admit: 2018-10-02 | Discharge: 2018-10-02 | Disposition: A | Discharge status: Home / Self Care | Payer: Federal, State, Local not specified - PPO | Attending: Otolaryngology | Admitting: Otolaryngology

## 2018-10-02 ENCOUNTER — Encounter
Admission: RE | Disposition: A | Discharge status: Home / Self Care | Payer: Self-pay | Source: Home / Self Care | Attending: Otolaryngology

## 2018-10-02 DIAGNOSIS — J342 Deviated nasal septum: Secondary | ICD-10-CM | POA: Diagnosis not present

## 2018-10-02 DIAGNOSIS — J323 Chronic sphenoidal sinusitis: Secondary | ICD-10-CM | POA: Insufficient documentation

## 2018-10-02 DIAGNOSIS — J3489 Other specified disorders of nose and nasal sinuses: Secondary | ICD-10-CM | POA: Diagnosis not present

## 2018-10-02 DIAGNOSIS — J343 Hypertrophy of nasal turbinates: Secondary | ICD-10-CM | POA: Insufficient documentation

## 2018-10-02 DIAGNOSIS — J320 Chronic maxillary sinusitis: Secondary | ICD-10-CM | POA: Diagnosis not present

## 2018-10-02 HISTORY — PX: SEPTOPLASTY: SHX2393

## 2018-10-02 HISTORY — PX: TURBINATE REDUCTION: SHX6157

## 2018-10-02 HISTORY — PX: MAXILLARY ANTROSTOMY: SHX2003

## 2018-10-02 HISTORY — PX: SPHENOIDECTOMY: SHX2421

## 2018-10-02 HISTORY — PX: IMAGE GUIDED SINUS SURGERY: SHX6570

## 2018-10-02 SURGERY — SINUS SURGERY, WITH IMAGING GUIDANCE
Anesthesia: General | Site: Nose

## 2018-10-02 MED ORDER — OXYCODONE-ACETAMINOPHEN 5-325 MG PO TABS: 1.0000 | ORAL_TABLET | Freq: Four times a day (QID) | ORAL | 0 refills | Status: AC | PRN

## 2018-10-02 MED ORDER — BACITRACIN-NEOMYCIN-POLYMYXIN 400-5-5000 EX OINT
TOPICAL_OINTMENT | CUTANEOUS | Status: DC | PRN
  Administered 2018-10-02: 1 via TOPICAL

## 2018-10-02 MED ORDER — DEXAMETHASONE SODIUM PHOSPHATE 4 MG/ML IJ SOLN
INTRAMUSCULAR | Status: DC | PRN
  Administered 2018-10-02: 10 mg via INTRAVENOUS

## 2018-10-02 MED ORDER — OXYMETAZOLINE HCL 0.05 % NA SOLN
NASAL | Status: DC | PRN
  Administered 2018-10-02: 1 via TOPICAL

## 2018-10-02 MED ORDER — FENTANYL CITRATE (PF) 100 MCG/2ML IJ SOLN
INTRAMUSCULAR | Status: DC | PRN
  Administered 2018-10-02: 100 ug via INTRAVENOUS

## 2018-10-02 MED ORDER — OXYCODONE HCL 5 MG/5ML PO SOLN: 5.0000 mg | Freq: Once | ORAL | Status: AC | PRN

## 2018-10-02 MED ORDER — ACETAMINOPHEN 10 MG/ML IV SOLN
1000.0000 mg | Freq: Once | INTRAVENOUS | Status: AC
  Administered 2018-10-02: 1000 mg via INTRAVENOUS

## 2018-10-02 MED ORDER — MIDAZOLAM HCL 5 MG/5ML IJ SOLN
INTRAMUSCULAR | Status: DC | PRN
  Administered 2018-10-02: 2 mg via INTRAVENOUS

## 2018-10-02 MED ORDER — ONDANSETRON HCL 4 MG/2ML IJ SOLN
INTRAMUSCULAR | Status: DC | PRN
  Administered 2018-10-02: 4 mg via INTRAVENOUS

## 2018-10-02 MED ORDER — FENTANYL CITRATE (PF) 100 MCG/2ML IJ SOLN
25.0000 ug | INTRAMUSCULAR | Status: DC | PRN
  Administered 2018-10-02 (×2): 25 ug via INTRAVENOUS

## 2018-10-02 MED ORDER — ONDANSETRON HCL 4 MG PO TABS: 4.0000 mg | ORAL_TABLET | Freq: Three times a day (TID) | ORAL | 0 refills | Status: DC | PRN

## 2018-10-02 MED ORDER — OXYCODONE HCL 5 MG PO TABS
5.0000 mg | ORAL_TABLET | Freq: Once | ORAL | Status: AC | PRN
  Administered 2018-10-02: 5 mg via ORAL

## 2018-10-02 MED ORDER — PROMETHAZINE HCL 25 MG/ML IJ SOLN: 6.2500 mg | INTRAMUSCULAR | Status: DC | PRN

## 2018-10-02 MED ORDER — LIDOCAINE HCL 4 % MT SOLN
OROMUCOSAL | Status: DC | PRN
  Administered 2018-10-02: 4 mL via TOPICAL

## 2018-10-02 MED ORDER — LIDOCAINE-EPINEPHRINE 1 %-1:100000 IJ SOLN
INTRAMUSCULAR | Status: DC | PRN
  Administered 2018-10-02: 6.5 mL

## 2018-10-02 MED ORDER — SCOPOLAMINE 1 MG/3DAYS TD PT72
1.0000 | MEDICATED_PATCH | TRANSDERMAL | Status: DC
  Administered 2018-10-02: 1.5 mg via TRANSDERMAL

## 2018-10-02 MED ORDER — AMOXICILLIN-POT CLAVULANATE 875-125 MG PO TABS: 1.0000 | ORAL_TABLET | Freq: Two times a day (BID) | ORAL | 0 refills | Status: DC

## 2018-10-02 MED ORDER — LACTATED RINGERS IV SOLN
10.0000 mL/h | INTRAVENOUS | Status: DC
  Administered 2018-10-02: 10 mL/h via INTRAVENOUS

## 2018-10-02 MED ORDER — LIDOCAINE HCL (CARDIAC) PF 100 MG/5ML IV SOSY
PREFILLED_SYRINGE | INTRAVENOUS | Status: DC | PRN
  Administered 2018-10-02: 40 mg via INTRAVENOUS

## 2018-10-02 MED ORDER — GLYCOPYRROLATE 0.2 MG/ML IJ SOLN
INTRAMUSCULAR | Status: DC | PRN
  Administered 2018-10-02: 0.1 mg via INTRAVENOUS

## 2018-10-02 MED ORDER — PROPOFOL 10 MG/ML IV BOLUS
INTRAVENOUS | Status: DC | PRN
  Administered 2018-10-02: 160 mg via INTRAVENOUS

## 2018-10-02 MED ORDER — EPHEDRINE SULFATE 50 MG/ML IJ SOLN
INTRAMUSCULAR | Status: DC | PRN
  Administered 2018-10-02 (×3): 5 mg via INTRAVENOUS

## 2018-10-02 MED ORDER — SUCCINYLCHOLINE CHLORIDE 20 MG/ML IJ SOLN
INTRAMUSCULAR | Status: DC | PRN
  Administered 2018-10-02: 100 mg via INTRAVENOUS

## 2018-10-02 SURGICAL SUPPLY — 35 items
BATTERY INSTRU NAVIGATION (MISCELLANEOUS) ×18 IMPLANT
CANISTER SUCT 1200ML W/VALVE (MISCELLANEOUS) ×6 IMPLANT
COAG SUCT 10F 3.5MM HAND CTRL (MISCELLANEOUS) ×6 IMPLANT
DRESSING NASL FOAM PST OP SINU (MISCELLANEOUS) ×4 IMPLANT
DRSG NASAL FOAM POST OP SINU (MISCELLANEOUS) ×6
ELECT REM PT RETURN 9FT ADLT (ELECTROSURGICAL) ×6
ELECTRODE REM PT RTRN 9FT ADLT (ELECTROSURGICAL) ×4 IMPLANT
GLOVE BIO SURGEON STRL SZ7.5 (GLOVE) ×18 IMPLANT
GOWN STRL REUS W/ TWL LRG LVL3 (GOWN DISPOSABLE) ×4 IMPLANT
GOWN STRL REUS W/TWL LRG LVL3 (GOWN DISPOSABLE) ×2
IV NS 500ML (IV SOLUTION) ×2
IV NS 500ML BAXH (IV SOLUTION) ×4 IMPLANT
KIT TURNOVER KIT A (KITS) ×6 IMPLANT
NEEDLE HYPO 25GX1X1/2 BEV (NEEDLE) ×6 IMPLANT
NS IRRIG 500ML POUR BTL (IV SOLUTION) ×6 IMPLANT
PACK ENT CUSTOM (PACKS) ×6 IMPLANT
PACKING NASAL EPIS 4X2.4 XEROG (MISCELLANEOUS) ×6 IMPLANT
PATTIES SURGICAL .5 X3 (DISPOSABLE) ×12 IMPLANT
SHAVER DIEGO BLD STD TYPE A (BLADE) ×6 IMPLANT
SOL ANTI-FOG 6CC FOG-OUT (MISCELLANEOUS) ×4 IMPLANT
SOL FOG-OUT ANTI-FOG 6CC (MISCELLANEOUS) ×2
SPLINT NASAL .50MM LRG (MISCELLANEOUS) ×6 IMPLANT
SPLINT NASAL SEPTAL BLV .25 LG (MISCELLANEOUS) ×6 IMPLANT
STRAP BODY AND KNEE 60X3 (MISCELLANEOUS) ×6 IMPLANT
SUT CHROMIC 4 0 RB 1X27 (SUTURE) ×6 IMPLANT
SUT ETHILON 4-0 (SUTURE) ×2
SUT ETHILON 4-0 FS2 18XMFL BLK (SUTURE) ×4
SUT PLAIN GUT 4-0 (SUTURE) IMPLANT
SUTURE ETHLN 4-0 FS2 18XMF BLK (SUTURE) ×4 IMPLANT
SYR 10ML LL (SYRINGE) IMPLANT
SYR BULB 3OZ (MISCELLANEOUS) ×6 IMPLANT
TOWEL OR 17X26 4PK STRL BLUE (TOWEL DISPOSABLE) ×6 IMPLANT
TRACKER CRANIALMASK (MASK) ×6 IMPLANT
TUBING DECLOG MULTIDEBRIDER (TUBING) ×6 IMPLANT
WATER STERILE IRR 250ML POUR (IV SOLUTION) ×6 IMPLANT

## 2018-10-02 NOTE — Transfer of Care (Signed)
Immediate Anesthesia Transfer of Care Note  Patient: Gabriel Dominguez.  Procedure(s) Performed: IMAGE GUIDED SINUS SURGERY (N/A Nose) SEPTOPLASTY (N/A Nose) BILATERAL INFERIOR TURBINATE REDUCTION (Bilateral ) BILATERAL MAXILLARY ANTROSTOMY (Bilateral Nose) SPHENOIDECTOMY (Left Nose)  Patient Location: PACU  Anesthesia Type: General ETT  Level of Consciousness: awake, alert  and patient cooperative  Airway and Oxygen Therapy: Patient Spontanous Breathing and Patient connected to supplemental oxygen  Post-op Assessment: Post-op Vital signs reviewed, Patient's Cardiovascular Status Stable, Respiratory Function Stable, Patent Airway and No signs of Nausea or vomiting  Post-op Vital Signs: Reviewed and stable  Complications: No apparent anesthesia complications

## 2018-10-02 NOTE — Op Note (Signed)
..10/02/2018  11:28 AM    Gabriel Dominguez  [id number]    Pre-Op Dx:  Chronic maxillary and sphenoid sinusitis, Deviated Nasal Septum, Hypertrophic Inferior Turbinates  Post-op Dx: Same  Proc:  1)   Nasal Septoplasty  2), Bilateral Partial Reduction Inferior Turbinates   3)  Bilateral Maxillary Antrostomy  4)  Left Sphenoidotomy  5)  IGSS  Surg:  Livie Vanderhoof  Anes:  GOT  EBL:  15ml  Comp:  none  Findings: severe left sided septal deviation with impaction onto inferior turbinate, Narrowed OMC bilaterally, successful left sphenoidotomy, bilateral inferior turbinate reduction  Procedure: With the patient in a comfortable supine position,  general orotracheal anesthesia was induced without difficulty.  The patient received preoperative Afrin spray for topical decongestion and vasoconstriction.  At an appropriate level, the patient was placed in a semi-sitting position.  Nasal vibrissae were trimmed.   1% Xylocaine with 1:100,000 epinephrine, 6.5 cc's, was infiltrated into the anterior floor of the nose, into the nasal spine region, into the membranous columella, and finally into the submucoperichondrial plane of the septum on both sides.  Several minutes were allowed for this to take effect.  Cottoniod pledgetts soaked in Afrin were placed into both nasal cavities and left while the patient was prepped and draped in the standard fashion.   A proper time-out was performed.  The Stryker image guidance system was set up and calibrated in the normal fashion with an acceptable error of 0.3mm.   The materials were removed from the nose and observed to be intact and correct in number.  The nose was inspected with a headlight and zero degree endoscope with the findings as described above.  A left Killian incision was sharply executed and carried down to the caudal edge of the quadrangular cartilage with a 15 blade scapel.  A mucoperichondrial flap was elelvated along the quadrangular  plate back to the bony-cartilaginous junction using caudal elevator and freer elevator. The mucoperiostium was then elevated along the ethmoid plate and the vomer. An itracartilagenous incision was made using the freer elevator and a contralateral mucoperichondiral flap was elevated using a freer elevator.  Care was taken to avoid any large rents or opposing rents in the mucoperichondrial flap.  Boney spurs of the vomer and maxillary crest were removed with Takahashi forceps.  A large bone spur was removed with chisel.  The area of cartilagenous deviation was removed with combination of freer elevator and Takahashi forceps creating a widely patent nasal cavity as well as resolution of obstruction from the cartilagenous deviation. The mucosal flaps were placed back into their anatomic position to allow visualization of the airways. The septum now sat in the midline with an improved airway.  A 4-0 Chromic was used to close the Luling incision as well.   The inferior turbinates were then inspected.  Under endoscopic visualization, the inferior turbinates were infractured bilaterally with a Therapist, nutritional.  A kelly clamp was attached to the anterior-inferior third of each inferior turbinate for approximately one minute.  Under endoscopic visualization, Tru-cutting forceps were used to remove the anterior-inferior third of each inferior turbinate.  Electrocautery was used to control bleeding in the area. The remaining turbinate was then outfractured to open up the airway further. There was no significant bleeding noted. The right turbinate was then trimmed and outfractured in a similar fashion.  The airways were then visualized and showed open passageways on both sides that were significantly improved compared to before surgery.  At this point, attention was directed to the functional endoscopic sinus surgery aspect of the procedure.  The nose was next inspected with a zero degree endoscope and the middle  turbinates were medialized and afrin soaked pledgets were placed lateral to the turbinates for approximately one minute.  The uncinate process was infractured with a maxillary ostia seeker.  At this time attention was directed to the patient's maxillary sinuses.  On the left, a ball tipped probe was placed through the natural ostia and this was used to create a larger opening.  Using a Diego microdebrider, the maxillary antrostomy was enlarged for a widely patent maxillary antrostomy.  This was repeated in a simlar fashion on the patient's right side.  Hemostasis was performed with topical Afrin soaked pledgets.  Visualization with a zero degree endoscope was used to examine the bilateral maxillary antrostomies which were noted to be widely patent and in continuity with the natural os bilaterally.  Attention at this time was directed to the patient's sphenoid sinus.  On the patient's left side, the middle turbinate was lateralized and the superior turbinate was identified.  The sphenoid os was visualized and the op was opened and enlarged with straight suction.  At this time with all sinuses opened, the patient's nasal cavity was examinated and copiously irrigated with sterile saline.  Meticulous hemostasis was continued and all sinuses opened were examined and noted to be widely patent.   Stamberger sinufoam was placed along maxillay antrostomies.  Xerogel was placed lateral to the middle turbinates.   There was no signifcant bleeding. Nasal splints were applied to both sides of the septum using Xomed 0.6mm regular sized splints that were trimmed, and then held in position with a 3-0 Nylon through and through suture.  Stamberger sinufoam was placed along the cut edge of the inferior turbinates bilaterally.  The patient was turned back over to anesthesia, and awakened, extubated, and taken to the PACU in satisfactory condition.  Dispo:   PACU to home  Plan: Ice, elevation, narcotic analgesia, and  prophylactic antibiotics for the duration of indwelling nasal foreign bodies.  We will reevaluate the patient in the office in 7 days and remove the septal splints.  Return to work in 10 days, strenuous activities in two weeks.   Dianna Deshler 10/02/2018 11:28 AM

## 2018-10-02 NOTE — Anesthesia Preprocedure Evaluation (Signed)
Anesthesia Evaluation  Patient identified by MRN, date of birth, ID band Patient awake    Reviewed: Allergy & Precautions, H&P , NPO status , Patient's Chart, lab work & pertinent test results  Airway Mallampati: II  TM Distance: >3 FB Neck ROM: full    Dental no notable dental hx.    Pulmonary    Pulmonary exam normal breath sounds clear to auscultation       Cardiovascular Normal cardiovascular exam Rhythm:regular Rate:Normal     Neuro/Psych    GI/Hepatic   Endo/Other    Renal/GU      Musculoskeletal   Abdominal   Peds  Hematology   Anesthesia Other Findings   Reproductive/Obstetrics                             Anesthesia Physical Anesthesia Plan  ASA: II  Anesthesia Plan: General ETT   Post-op Pain Management:    Induction:   PONV Risk Score and Plan: 2 and Midazolam, Ondansetron, Dexamethasone, Scopolamine patch - Pre-op and Treatment may vary due to age or medical condition  Airway Management Planned:   Additional Equipment:   Intra-op Plan:   Post-operative Plan:   Informed Consent: I have reviewed the patients History and Physical, chart, labs and discussed the procedure including the risks, benefits and alternatives for the proposed anesthesia with the patient or authorized representative who has indicated his/her understanding and acceptance.       Plan Discussed with: CRNA  Anesthesia Plan Comments:         Anesthesia Quick Evaluation

## 2018-10-02 NOTE — Anesthesia Postprocedure Evaluation (Signed)
Anesthesia Post Note  Patient: Julez Zong.  Procedure(s) Performed: IMAGE GUIDED SINUS SURGERY (N/A Nose) SEPTOPLASTY (N/A Nose) BILATERAL INFERIOR TURBINATE REDUCTION (Bilateral ) BILATERAL MAXILLARY ANTROSTOMY (Bilateral Nose) SPHENOIDECTOMY (Left Nose)  Patient location during evaluation: PACU Anesthesia Type: General Level of consciousness: awake and alert and oriented Pain management: satisfactory to patient Vital Signs Assessment: post-procedure vital signs reviewed and stable Respiratory status: spontaneous breathing, nonlabored ventilation and respiratory function stable Cardiovascular status: blood pressure returned to baseline and stable Postop Assessment: Adequate PO intake and No signs of nausea or vomiting Anesthetic complications: no    Cherly Beach

## 2018-10-02 NOTE — H&P (Signed)
..  History and Physical paper copy reviewed and updated date of procedure and will be scanned into system.  Patient seen and examined.  

## 2018-10-02 NOTE — Anesthesia Procedure Notes (Signed)
Procedure Name: Intubation Date/Time: 10/02/2018 9:42 AM Performed by: Jimmy Picket, CRNA Pre-anesthesia Checklist: Patient identified, Emergency Drugs available, Suction available, Patient being monitored and Timeout performed Patient Re-evaluated:Patient Re-evaluated prior to induction Oxygen Delivery Method: Circle system utilized Preoxygenation: Pre-oxygenation with 100% oxygen Induction Type: IV induction Ventilation: Mask ventilation without difficulty Laryngoscope Size: Miller and 3 Grade View: Grade I Tube type: Oral Rae Tube size: 7.5 mm Number of attempts: 1 Placement Confirmation: ETT inserted through vocal cords under direct vision,  positive ETCO2 and breath sounds checked- equal and bilateral Tube secured with: Tape Dental Injury: Teeth and Oropharynx as per pre-operative assessment

## 2018-10-02 NOTE — Discharge Instructions (Signed)
[facility] °[facility] °ENDOSCOPIC SINUS SURGERY °[facility] ° °What is Functional Endoscopic Sinus Surgery? ° The Surgery involves making the natural openings of the sinuses larger by removing the bony partitions that separate the sinuses from the nasal cavity.  The natural sinus lining is preserved as much as possible to allow the sinuses to resume normal function after the surgery.  In some patients nasal polyps (excessively swollen lining of the sinuses) may be removed to relieve obstruction of the sinus openings.  The surgery is performed through the nose using lighted scopes, which eliminates the need for incisions on the face.  A septoplasty is a different procedure which is sometimes performed with sinus surgery.  It involves straightening the boy partition that separates the two sides of your nose.  A crooked or deviated septum may need repair if is obstructing the sinuses or nasal airflow.  Turbinate reduction is also often performed during sinus surgery.  The turbinates are bony proturberances from the side walls of the nose which swell and can obstruct the nose in patients with sinus and allergy problems.  Their size can be surgically reduced to help relieve nasal obstruction. ° °What Can Sinus Surgery Do For Me? ° Sinus surgery can reduce the frequency of sinus infections requiring antibiotic treatment.  This can provide improvement in nasal congestion, post-nasal drainage, facial pressure and nasal obstruction.  Surgery will NOT prevent you from ever having an infection again, so it usually only for patients who get infections 4 or more times yearly requiring antibiotics, or for infections that do not clear with antibiotics.  It will not cure nasal allergies, so patients with allergies may still require medication to treat their allergies after surgery. Surgery may improve headaches related to sinusitis, however, some people will continue to  require medication to control sinus headaches related to allergies.  Surgery will do nothing for other forms of headache (migraine, tension or cluster). ° °What Are the Risks of Endoscopic Sinus Surgery? ° Current techniques allow surgery to be performed safely with little risk, however, there are rare complications that patients should be aware of.  Because the sinuses are located around the eyes, there is risk of eye injury, including blindness, though again, this would be quite rare. This is usually a result of bleeding behind the eye during surgery, which puts the vision oat risk, though there are treatments to protect the vision and prevent permanent disrupted by surgery causing a leak of the spinal fluid that surrounds the brain.  More serious complications would include bleeding inside the brain cavity or damage to the brain.  Again, all of these complications are uncommon, and spinal fluid leaks can be safely managed surgically if they occur.  The most common complication of sinus surgery is bleeding from the nose, which may require packing or cauterization of the nose.  Continued sinus have polyps may experience recurrence of the polyps requiring revision surgery.  Alterations of sense of smell or injury to the tear ducts are also rare complications.  ° °What is the Surgery Like, and what is the Recovery? ° The Surgery usually takes a couple of hours to perform, and is usually performed under a general anesthetic (completely asleep).  Patients are usually discharged home after a couple of hours.  Sometimes during surgery it is necessary to pack the nose to control bleeding, and the packing is left in place for 24 - 48 hours, and removed by your surgeon.    If a septoplasty was performed during the procedure, there is often a splint placed which must be removed after 5-7 days.   °Discomfort: Pain is usually mild to moderate, and can be controlled by prescription pain medication or acetaminophen (Tylenol).   Aspirin, Ibuprofen (Advil, Motrin), or Naprosyn (Aleve) should be avoided, as they can cause increased bleeding.  Most patients feel sinus pressure like they have a bad head cold for several days.  Sleeping with your head elevated can help reduce swelling and facial pressure, as can ice packs over the face.  A humidifier may be helpful to keep the mucous and blood from drying in the nose.  ° °Diet: There are no specific diet restrictions, however, you should generally start with clear liquids and a light diet of bland foods because the anesthetic can cause some nausea.  Advance your diet depending on how your stomach feels.  Taking your pain medication with food will often help reduce stomach upset which pain medications can cause. ° °Nasal Saline Irrigation: It is important to remove blood clots and dried mucous from the nose as it is healing.  This is done by having you irrigate the nose at least 3 - 4 times daily with a salt water solution.  We recommend using NeilMed Sinus Rinse (available at the drug store).  Fill the squeeze bottle with the solution, bend over a sink, and insert the tip of the squeeze bottle into the nose ½ of an inch.  Point the tip of the squeeze bottle towards the inside corner of the eye on the same side your irrigating.  Squeeze the bottle and gently irrigate the nose.  If you bend forward as you do this, most of the fluid will flow back out of the nose, instead of down your throat.   The solution should be warm, near body temperature, when you irrigate.   Each time you irrigate, you should use a full squeeze bottle.  ° °Note that if you are instructed to use Nasal Steroid Sprays at any time after your surgery, irrigate with saline BEFORE using the steroid spray, so you do not wash it all out of the nose. °Another product, Nasal Saline Gel (such as AYR Nasal Saline Gel) can be applied in each nostril 3 - 4 times daily to moisture the nose and reduce scabbing or crusting. ° °Bleeding:   Bloody drainage from the nose can be expected for several days, and patients are instructed to irrigate their nose frequently with salt water to help remove mucous and blood clots.  The drainage may be dark red or brown, though some fresh blood may be seen intermittently, especially after irrigation.  Do not blow you nose, as bleeding may occur. If you must sneeze, keep your mouth open to allow air to escape through your mouth. ° °If heavy bleeding occurs: Irrigate the nose with saline to rinse out clots, then spray the nose 3 - 4 times with Afrin Nasal Decongestant Spray.  The spray will constrict the blood vessels to slow bleeding.  Pinch the lower half of your nose shut to apply pressure, and lay down with your head elevated.  Ice packs over the nose may help as well. If bleeding persists despite these measures, you should notify your doctor.  Do not use the Afrin routinely to control nasal congestion after surgery, as it can result in worsening congestion and may affect healing.  ° °Activity: Return to work varies among patients. Most patients will be out of   work at least 5 - 7 days to recover.  Patient may return to work after they are off of narcotic pain medication, and feeling well enough to perform the functions of their job.  Patients must avoid heavy lifting (over 10 pounds) or strenuous physical for 2 weeks after surgery, so your employer may need to assign you to light duty, or keep you out of work longer if light duty is not possible.  NOTE: you should not drive, operate dangerous machinery, do any mentally demanding tasks or make any important legal or financial decisions while on narcotic pain medication and recovering from the general anesthetic.  °  °Call Your Doctor Immediately if You Have Any of the Following: °1. Bleeding that you cannot control with the above measures °2. Loss of vision, double vision, bulging of the eye or black eyes. °3. Fever over 101 degrees °4. Neck stiffness with severe  headache, fever, nausea and change in mental state. °You are always encourage to call anytime with concerns, however, please call with requests for pain medication refills during office hours. ° °Office Endoscopy: During follow-up visits your doctor will remove any packing or splints that may have been placed and evaluate and clean your sinuses endoscopically.  Topical anesthetic will be used to make this as comfortable as possible, though you may want to take your pain medication prior to the visit.  How often this will need to be done varies from patient to patient.  After complete recovery from the surgery, you may need follow-up endoscopy from time to time, particularly if there is concern of recurrent infection or nasal polyps. ° °Scopolamine skin patches °REMOVE PATCH IN 72 HOURS AND WASH HANDS IMMEDIATELY °What is this medicine? °SCOPOLAMINE (skoe POL a meen) is used to prevent nausea and vomiting caused by motion sickness, anesthesia and surgery. °This medicine may be used for other purposes; ask your health care provider or pharmacist if you have questions. °COMMON BRAND NAME(S): Transderm Scop °What should I tell my health care provider before I take this medicine? °They need to know if you have any of these conditions: °-are scheduled to have a gastric secretion test °-glaucoma °-heart disease °-kidney disease °-liver disease °-lung or breathing disease, like asthma °-mental illness °-prostate disease °-seizures °-stomach or intestine problems °-trouble passing urine °-an unusual or allergic reaction to scopolamine, atropine, other medicines, foods, dyes, or preservatives °-pregnant or trying to get pregnant °-breast-feeding °How should I use this medicine? °This medicine is for external use only. Follow the directions on the prescription label. Wear only 1 patch at a time. Choose an area behind the ear, that is clean, dry, hairless and free from any cuts or irritation. Wipe the area with a clean dry  tissue. Peel off the plastic backing of the skin patch, trying not to touch the adhesive side with your hands. Do not cut the patches. Firmly apply to the area you have chosen, with the metallic side of the patch to the skin and the tan-colored side showing. Once firmly in place, wash your hands well with soap and water. Do not get this medicine into your eyes. °After removing the patch, wash your hands and the area behind your ear thoroughly with soap and water. The patch will still contain some medicine after use. To avoid accidental contact or ingestion by children or pets, fold the used patch in half with the sticky side together and throw away in the trash out of the reach of children and pets. If   you need to use a second patch after you remove the first, place it behind the other ear. °A special MedGuide will be given to you by the pharmacist with each prescription and refill. Be sure to read this information carefully each time. °Talk to your pediatrician regarding the use of this medicine in children. Special care may be needed. °Overdosage: If you think you have taken too much of this medicine contact a poison control center or emergency room at once. °NOTE: This medicine is only for you. Do not share this medicine with others. °What if I miss a dose? °This does not apply. This medicine is not for regular use. °What may interact with this medicine? °-alcohol °-antihistamines for allergy cough and cold °-atropine °-certain medicines for anxiety or sleep °-certain medicines for bladder problems like oxybutynin, tolterodine °-certain medicines for depression like amitriptyline, fluoxetine, sertraline °-certain medicines for stomach problems like dicyclomine, hyoscyamine °-certain medicines for Parkinson's disease like benztropine, trihexyphenidyl °-certain medicines for seizures like phenobarbital, primidone °-general anesthetics like halothane, isoflurane, methoxyflurane, propofol °-ipratropium °-local  anesthetics like lidocaine, pramoxine, tetracaine °-medicines that relax muscles for surgery °-phenothiazines like chlorpromazine, mesoridazine, prochlorperazine, thioridazine °-narcotic medicines for pain °-other belladonna alkaloids °This list may not describe all possible interactions. Give your health care provider a list of all the medicines, herbs, non-prescription drugs, or dietary supplements you use. Also tell them if you smoke, drink alcohol, or use illegal drugs. Some items may interact with your medicine. °What should I watch for while using this medicine? °Limit contact with water while swimming and bathing because the patch may fall off. If the patch falls off, throw it away and put a new one behind the other ear. °You may get drowsy or dizzy. Do not drive, use machinery, or do anything that needs mental alertness until you know how this medicine affects you. Do not stand or sit up quickly, especially if you are an older patient. This reduces the risk of dizzy or fainting spells. Alcohol may interfere with the effect of this medicine. Avoid alcoholic drinks. °Your mouth may get dry. Chewing sugarless gum or sucking hard candy, and drinking plenty of water may help. Contact your healthcare professional if the problem does not go away or is severe. °This medicine may cause dry eyes and blurred vision. If you wear contact lenses, you may feel some discomfort. Lubricating drops may help. See your healthcare professional if the problem does not go away or is severe. °If you are going to need surgery, an MRI, CT scan, or other procedure, tell your healthcare professional that you are using this medicine. You may need to remove the patch before the procedure. °What side effects may I notice from receiving this medicine? °Side effects that you should report to your doctor or health care professional as soon as possible: °-allergic reactions like skin rash, itching or hives; swelling of the face, lips, or  tongue °-blurred vision °-changes in vision °-confusion °-dizziness °-eye pain °-fast, irregular heartbeat °-hallucinations, loss of contact with reality °-nausea, vomiting °-pain or trouble passing urine °-restlessness °-seizures °-skin irritation °-stomach pain °Side effects that usually do not require medical attention (report to your doctor or health care professional if they continue or are bothersome): °-drowsiness °-dry mouth °-headache °-sore throat °This list may not describe all possible side effects. Call your doctor for medical advice about side effects. You may report side effects to FDA at 1-800-FDA-1088. °Where should I keep my medicine? °Keep out of the reach of children. °  Store at room temperature between 20 and 25 degrees C (68 and 77 degrees F). Keep this medicine in the foil package until ready to use. Throw away any unused medicine after the expiration date. °NOTE: This sheet is a summary. It may not cover all possible information. If you have questions about this medicine, talk to your doctor, pharmacist, or health care provider. °© 2019 [employer]vier/[employer] Standard (2017-09-21 16:14:46) ° ° °General Anesthesia, Adult, Care After °This sheet gives you information about how to care for yourself after your procedure. Your health care provider may also give you more specific instructions. If you have problems or questions, contact your health care provider. °What can I expect after the procedure? °After the procedure, the following side effects are common: °· Pain or discomfort at the IV site. °· Nausea. °· Vomiting. °· Sore throat. °· Trouble concentrating. °· Feeling cold or chills. °· Weak or tired. °· Sleepiness and fatigue. °· Soreness and body aches. These side effects can affect parts of the body that were not involved in surgery. °Follow these instructions at home: ° °For at least 24 hours after the procedure: °· Have a responsible adult stay with you. It is important to have someone help care  for you until you are awake and alert. °· Rest as needed. °· Do not: °? Participate in activities in which you could fall or become injured. °? Drive. °? Use heavy machinery. °? Drink alcohol. °? Take sleeping pills or medicines that cause drowsiness. °? Make important decisions or sign legal documents. °? Take care of children on your own. °Eating and drinking °· Follow any instructions from your health care provider about eating or drinking restrictions. °· When you feel hungry, start by eating small amounts of foods that are soft and easy to digest (bland), such as toast. Gradually return to your regular diet. °· Drink enough fluid to keep your urine pale yellow. °· If you vomit, rehydrate by drinking water, juice, or clear broth. °General instructions °· If you have sleep apnea, surgery and certain medicines can increase your risk for breathing problems. Follow instructions from your health care provider about wearing your sleep device: °? Anytime you are sleeping, including during daytime naps. °? While taking prescription pain medicines, sleeping medicines, or medicines that make you drowsy. °· Return to your normal activities as told by your health care provider. Ask your health care provider what activities are safe for you. °· Take over-the-counter and prescription medicines only as told by your health care provider. °· If you smoke, do not smoke without supervision. °· Keep all follow-up visits as told by your health care provider. This is important. °Contact a health care provider if: °· You have nausea or vomiting that does not get better with medicine. °· You cannot eat or drink without vomiting. °· You have pain that does not get better with medicine. °· You are unable to pass urine. °· You develop a skin rash. °· You have a fever. °· You have redness around your IV site that gets worse. °Get help right away if: °· You have difficulty breathing. °· You have chest pain. °· You have blood in your urine  or stool, or you vomit blood. °Summary °· After the procedure, it is common to have a sore throat or nausea. It is also common to feel tired. °· Have a responsible adult stay with you for the first 24 hours after general anesthesia. It is important to have someone help care for you   until you are awake and alert. °· When you feel hungry, start by eating small amounts of foods that are soft and easy to digest (bland), such as toast. Gradually return to your regular diet. °· Drink enough fluid to keep your urine pale yellow. °· Return to your normal activities as told by your health care provider. Ask your health care provider what activities are safe for you. °This information is not intended to replace advice given to you by your health care provider. Make sure you discuss any questions you have with your health care provider. °Document Released: 10/09/2000 Document Revised: 02/16/2017 Document Reviewed: 02/16/2017 °Elsevier Interactive Patient Education © 2019 [employer] ° °

## 2018-10-23 DIAGNOSIS — Z48813 Encounter for surgical aftercare following surgery on the respiratory system: Secondary | ICD-10-CM | POA: Diagnosis not present

## 2018-11-04 ENCOUNTER — Ambulatory Visit (INDEPENDENT_AMBULATORY_CARE_PROVIDER_SITE_OTHER): Payer: Federal, State, Local not specified - PPO | Admitting: Family

## 2018-11-04 DIAGNOSIS — E669 Obesity, unspecified: Secondary | ICD-10-CM

## 2018-11-04 DIAGNOSIS — T7840XD Allergy, unspecified, subsequent encounter: Secondary | ICD-10-CM | POA: Diagnosis not present

## 2018-11-04 MED ORDER — BUPROPION HCL ER (XL) 150 MG PO TB24: ORAL_TABLET | ORAL | 3 refills | Status: DC

## 2018-11-04 MED ORDER — EPINEPHRINE 0.3 MG/0.3ML IJ SOAJ: 0.3000 mg | INTRAMUSCULAR | 3 refills | Status: AC | PRN

## 2018-11-04 NOTE — Patient Instructions (Addendum)
Trial of wellbutrin for weight loss.  We discussed today starting medication called Wellbutrin.  As also discussed, you must limit alcohol on this medication as alcohol and Wellbutrin together may increase your risk for seizure.  You may drink no more than 1 alcoholic beverage on this medication.  A standard drink is 12 ounces of regular beer, which is usually about 5% alcohol OR 5 ounces of wine, which is typically about 12% alcohol OR   1.5 ounces of distilled spirits, which is about 40% alcohol  Please return for physical, labs later this year.   Stay safe and let me know how you are doing on the wellbutrin!
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
## 2018-11-06 ENCOUNTER — Encounter: Payer: Self-pay | Admitting: Family

## 2018-11-06 NOTE — Assessment & Plan Note (Signed)
Trial of Wellbutrin.  Patient will let me know how he is doing on this medication.  Counseled on risks of medication; advised no more than one alcoholic beverage in one sitting on wellbutrin. He verbalized understanding. Close follow-up.

## 2018-11-06 NOTE — Assessment & Plan Note (Signed)
Has been stable of late. Declines f/u with allergy at this time. Epi Pen refilled and advised on keeping with him at all times. He verbalized understanding

## 2018-11-27 ENCOUNTER — Other Ambulatory Visit: Payer: Self-pay | Admitting: Family

## 2018-11-27 DIAGNOSIS — E669 Obesity, unspecified: Secondary | ICD-10-CM

## 2018-12-18 ENCOUNTER — Ambulatory Visit: Payer: Federal, State, Local not specified - PPO | Admitting: Family

## 2020-08-05 ENCOUNTER — Other Ambulatory Visit (HOSPITAL_COMMUNITY): Payer: Self-pay | Admitting: Otolaryngology

## 2020-08-05 ENCOUNTER — Other Ambulatory Visit: Payer: Self-pay | Admitting: Otolaryngology

## 2020-08-05 DIAGNOSIS — R1310 Dysphagia, unspecified: Secondary | ICD-10-CM

## 2020-08-05 DIAGNOSIS — J301 Allergic rhinitis due to pollen: Secondary | ICD-10-CM | POA: Diagnosis not present

## 2020-08-05 DIAGNOSIS — R1314 Dysphagia, pharyngoesophageal phase: Secondary | ICD-10-CM | POA: Diagnosis not present

## 2020-08-05 DIAGNOSIS — J320 Chronic maxillary sinusitis: Secondary | ICD-10-CM | POA: Diagnosis not present

## 2020-08-11 ENCOUNTER — Other Ambulatory Visit: Payer: Self-pay

## 2020-08-11 ENCOUNTER — Ambulatory Visit
Admission: RE | Admit: 2020-08-11 | Discharge: 2020-08-11 | Disposition: A | Discharge status: Home / Self Care | Payer: Federal, State, Local not specified - PPO | Source: Ambulatory Visit | Attending: Otolaryngology | Admitting: Otolaryngology

## 2020-08-11 DIAGNOSIS — R1310 Dysphagia, unspecified: Secondary | ICD-10-CM | POA: Insufficient documentation

## 2020-08-11 DIAGNOSIS — K219 Gastro-esophageal reflux disease without esophagitis: Secondary | ICD-10-CM | POA: Diagnosis not present

## 2020-09-30 ENCOUNTER — Ambulatory Visit: Payer: Federal, State, Local not specified - PPO | Admitting: Gastroenterology

## 2020-09-30 ENCOUNTER — Other Ambulatory Visit: Payer: Self-pay

## 2020-09-30 ENCOUNTER — Encounter: Payer: Self-pay | Admitting: Gastroenterology

## 2020-09-30 VITALS — BP 131/88 | HR 101 | Ht 67.0 in | Wt 256.0 lb

## 2020-09-30 DIAGNOSIS — R1319 Other dysphagia: Secondary | ICD-10-CM

## 2020-09-30 DIAGNOSIS — S62609B Fracture of unspecified phalanx of unspecified finger, initial encounter for open fracture: Secondary | ICD-10-CM | POA: Insufficient documentation
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
## 2020-10-01 ENCOUNTER — Other Ambulatory Visit: Payer: Self-pay

## 2020-10-01 DIAGNOSIS — Z1211 Encounter for screening for malignant neoplasm of colon: Secondary | ICD-10-CM

## 2020-10-01 DIAGNOSIS — Z8371 Family history of colonic polyps: Secondary | ICD-10-CM

## 2020-10-01 DIAGNOSIS — R1319 Other dysphagia: Secondary | ICD-10-CM

## 2020-10-05 ENCOUNTER — Other Ambulatory Visit: Payer: Self-pay

## 2020-10-05 ENCOUNTER — Other Ambulatory Visit
Admission: RE | Admit: 2020-10-05 | Discharge: 2020-10-05 | Disposition: A | Discharge status: Home / Self Care | Payer: Federal, State, Local not specified - PPO | Source: Ambulatory Visit | Attending: Gastroenterology | Admitting: Gastroenterology

## 2020-10-05 DIAGNOSIS — Z79899 Other long term (current) drug therapy: Secondary | ICD-10-CM | POA: Diagnosis not present

## 2020-10-05 DIAGNOSIS — Z1211 Encounter for screening for malignant neoplasm of colon: Secondary | ICD-10-CM | POA: Diagnosis not present

## 2020-10-05 DIAGNOSIS — K222 Esophageal obstruction: Secondary | ICD-10-CM | POA: Diagnosis not present

## 2020-10-05 DIAGNOSIS — Z8249 Family history of ischemic heart disease and other diseases of the circulatory system: Secondary | ICD-10-CM | POA: Diagnosis not present

## 2020-10-05 DIAGNOSIS — Z20822 Contact with and (suspected) exposure to covid-19: Secondary | ICD-10-CM | POA: Insufficient documentation

## 2020-10-05 DIAGNOSIS — R1310 Dysphagia, unspecified: Secondary | ICD-10-CM | POA: Diagnosis not present

## 2020-10-05 DIAGNOSIS — Z8371 Family history of colonic polyps: Secondary | ICD-10-CM | POA: Diagnosis not present

## 2020-10-05 DIAGNOSIS — Z825 Family history of asthma and other chronic lower respiratory diseases: Secondary | ICD-10-CM | POA: Diagnosis not present

## 2020-10-05 DIAGNOSIS — Z01812 Encounter for preprocedural laboratory examination: Secondary | ICD-10-CM | POA: Insufficient documentation

## 2020-10-05 DIAGNOSIS — K2100 Gastro-esophageal reflux disease with esophagitis, without bleeding: Secondary | ICD-10-CM | POA: Diagnosis not present

## 2020-10-05 LAB — SARS CORONAVIRUS 2 (TAT 6-24 HRS): SARS Coronavirus 2: NEGATIVE

## 2020-10-05 MED ORDER — PEG 3350-KCL-NA BICARB-NACL 420 G PO SOLR: ORAL | 0 refills | Status: DC

## 2020-10-07 ENCOUNTER — Ambulatory Visit
Admission: RE | Admit: 2020-10-07 | Discharge: 2020-10-07 | Disposition: A | Discharge status: Home / Self Care | Payer: Federal, State, Local not specified - PPO | Source: Ambulatory Visit | Attending: Gastroenterology | Admitting: Gastroenterology

## 2020-10-07 ENCOUNTER — Ambulatory Visit: Payer: Federal, State, Local not specified - PPO | Admitting: Registered Nurse

## 2020-10-07 ENCOUNTER — Encounter
Admission: RE | Disposition: A | Discharge status: Home / Self Care | Payer: Self-pay | Source: Ambulatory Visit | Attending: Gastroenterology

## 2020-10-07 ENCOUNTER — Encounter: Payer: Self-pay | Admitting: Gastroenterology

## 2020-10-07 DIAGNOSIS — Z20822 Contact with and (suspected) exposure to covid-19: Secondary | ICD-10-CM | POA: Diagnosis not present

## 2020-10-07 DIAGNOSIS — Z825 Family history of asthma and other chronic lower respiratory diseases: Secondary | ICD-10-CM | POA: Diagnosis not present

## 2020-10-07 DIAGNOSIS — K2100 Gastro-esophageal reflux disease with esophagitis, without bleeding: Secondary | ICD-10-CM

## 2020-10-07 DIAGNOSIS — R1310 Dysphagia, unspecified: Secondary | ICD-10-CM | POA: Diagnosis not present

## 2020-10-07 DIAGNOSIS — Z8371 Family history of colonic polyps: Secondary | ICD-10-CM | POA: Diagnosis not present

## 2020-10-07 DIAGNOSIS — R1319 Other dysphagia: Secondary | ICD-10-CM | POA: Diagnosis not present

## 2020-10-07 DIAGNOSIS — K222 Esophageal obstruction: Secondary | ICD-10-CM

## 2020-10-07 DIAGNOSIS — Z8249 Family history of ischemic heart disease and other diseases of the circulatory system: Secondary | ICD-10-CM | POA: Insufficient documentation

## 2020-10-07 DIAGNOSIS — Z79899 Other long term (current) drug therapy: Secondary | ICD-10-CM | POA: Diagnosis not present

## 2020-10-07 DIAGNOSIS — Z1211 Encounter for screening for malignant neoplasm of colon: Secondary | ICD-10-CM | POA: Insufficient documentation

## 2020-10-07 DIAGNOSIS — Z83719 Family history of colon polyps, unspecified: Secondary | ICD-10-CM

## 2020-10-07 DIAGNOSIS — K209 Esophagitis, unspecified without bleeding: Secondary | ICD-10-CM | POA: Diagnosis not present

## 2020-10-07 HISTORY — PX: COLONOSCOPY WITH PROPOFOL: SHX5780

## 2020-10-07 HISTORY — PX: ESOPHAGOGASTRODUODENOSCOPY (EGD) WITH PROPOFOL: SHX5813

## 2020-10-07 SURGERY — COLONOSCOPY WITH PROPOFOL
Anesthesia: General

## 2020-10-07 MED ORDER — PROPOFOL 10 MG/ML IV BOLUS
INTRAVENOUS | Status: DC | PRN
  Administered 2020-10-07: 100 mg via INTRAVENOUS

## 2020-10-07 MED ORDER — SODIUM CHLORIDE 0.9 % IV SOLN: INTRAVENOUS | Status: DC

## 2020-10-07 MED ORDER — DEXMEDETOMIDINE HCL 200 MCG/2ML IV SOLN
INTRAVENOUS | Status: DC | PRN
  Administered 2020-10-07: 12 ug via INTRAVENOUS
  Administered 2020-10-07: 8 ug via INTRAVENOUS

## 2020-10-07 MED ORDER — PROPOFOL 500 MG/50ML IV EMUL
INTRAVENOUS | Status: DC | PRN
  Administered 2020-10-07: 140 ug/kg/min via INTRAVENOUS

## 2020-10-07 MED ORDER — LIDOCAINE HCL (CARDIAC) PF 100 MG/5ML IV SOSY
PREFILLED_SYRINGE | INTRAVENOUS | Status: DC | PRN
  Administered 2020-10-07: 100 mg via INTRAVENOUS

## 2020-10-07 MED ORDER — PANTOPRAZOLE SODIUM 40 MG PO TBEC: 40.0000 mg | DELAYED_RELEASE_TABLET | Freq: Every day | ORAL | 11 refills | Status: DC
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
## 2020-10-07 NOTE — Transfer of Care (Signed)
Immediate Anesthesia Transfer of Care Note  Patient: Gabriel Dominguez.  Procedure(s) Performed: COLONOSCOPY WITH PROPOFOL (N/A ) ESOPHAGOGASTRODUODENOSCOPY (EGD) WITH PROPOFOL (N/A )  Patient Location: PACU  Anesthesia Type:General  Level of Consciousness: awake, alert  and oriented  Airway & Oxygen Therapy: Patient Spontanous Breathing and Patient connected to nasal cannula oxygen  Post-op Assessment: Report given to RN and Post -op Vital signs reviewed and stable  Post vital signs: Reviewed and stable  Last Vitals:  Vitals Value Taken Time  BP 102/85 10/07/20 1002  Temp 36.4 C 10/07/20 1001  Pulse 103 10/07/20 1002  Resp 17 10/07/20 1002  SpO2 93 % 10/07/20 1002  Vitals shown include unvalidated device data.  Last Pain:  Vitals:   10/07/20 1001  TempSrc: Temporal  PainSc: Asleep         Complications: No complications documented.

## 2020-10-07 NOTE — Op Note (Signed)
[facility] Gastroenterology Patient Name: Gabriel Dominguez Procedure Date: 10/07/2020 9:35 AM MRN: [id number] Account #: [id number] Date of Birth: 1980-09-29 Admit Type: Outpatient Age: 40 Room: [facility] ENDO ROOM 4 Gender: Male Note Status: Finalized Procedure:             Upper GI endoscopy Indications:           Dysphagia Providers:             Midge Minium MD, MD Referring MD:          Lyn Records. Arnett (Referring MD) Medicines:             Propofol per Anesthesia Complications:         No immediate complications. Procedure:             Pre-Anesthesia Assessment:                        - Prior to the procedure, a History and Physical was                         performed, and patient medications and allergies were                         reviewed. The patient's tolerance of previous                         anesthesia was also reviewed. The risks and benefits                         of the procedure and the sedation options and risks                         were discussed with the patient. All questions were                         answered, and informed consent was obtained. Prior                         Anticoagulants: The patient has taken no previous                         anticoagulant or antiplatelet agents. ASA Grade                         Assessment: II - A patient with mild systemic disease.                         After reviewing the risks and benefits, the patient                         was deemed in satisfactory condition to undergo the                         procedure.                        After obtaining informed consent, the endoscope was  passed under direct vision. Throughout the procedure,                         the patient's blood pressure, pulse, and oxygen                         saturations were monitored continuously. The Endoscope                         was introduced through the mouth, and advanced to the                          second part of duodenum. The upper GI endoscopy was                         accomplished without difficulty. The patient tolerated                         the procedure well. Findings:      LA Grade C (one or more mucosal breaks continuous between tops of 2 or       more mucosal folds, less than 75% circumference) esophagitis with no       bleeding was found in the lower third of the esophagus.      One benign-appearing, intrinsic mild stenosis was found at the       gastroesophageal junction. The stenosis was traversed. A TTS dilator was       passed through the scope. Dilation with a 15-16.5-18 mm balloon dilator       was performed to 18 mm. The dilation site was examined following       endoscope reinsertion and showed complete resolution of luminal       narrowing.      The stomach was normal.      The examined duodenum was normal. Impression:            - LA Grade C reflux esophagitis with no bleeding.                        - Benign-appearing esophageal stenosis. Dilated.                        - Normal stomach.                        - Normal examined duodenum.                        - No specimens collected. Recommendation:        - Discharge patient to home.                        - Resume previous diet.                        - Continue present medications.                        - Use a proton pump inhibitor PO daily. Procedure Code(s):     --- Professional ---  [phone], Esophagogastroduodenoscopy, flexible,                         transoral; with transendoscopic balloon dilation of                         esophagus (less than 30 mm diameter) Diagnosis Code(s):     --- Professional ---                        R13.10, Dysphagia, unspecified                        K22.2, Esophageal obstruction                        K21.00, Gastro-esophageal reflux disease with                         esophagitis, without bleeding CPT copyright 2019  American Medical Association. All rights reserved. The codes documented in this report are preliminary and upon coder review may  be revised to meet current compliance requirements. Midge Minium MD, MD 10/07/2020 9:47:43 AM This report has been signed electronically. Number of Addenda: 0 Note Initiated On: 10/07/2020 9:35 AM Estimated Blood Loss:  Estimated blood loss: none.      [facility]

## 2020-10-07 NOTE — Op Note (Signed)
[facility] Gastroenterology Patient Name: Gabriel Dominguez Procedure Date: 10/07/2020 9:35 AM MRN: [id number] Account #: [id number] Date of Birth: 18-Mar-1981 Admit Type: Outpatient Age: 40 Room: [facility] ENDO ROOM 4 Gender: Male Note Status: Finalized Procedure:             Colonoscopy Indications:           Colon cancer screening in patient at increased risk:                         Family history of 1st-degree relative with colon                         polyps before age 77 years Providers:             Midge Minium MD, MD Referring MD:          Lyn Records. Arnett (Referring MD) Medicines:             Propofol per Anesthesia Complications:         No immediate complications. Procedure:             Pre-Anesthesia Assessment:                        - Prior to the procedure, a History and Physical was                         performed, and patient medications and allergies were                         reviewed. The patient's tolerance of previous                         anesthesia was also reviewed. The risks and benefits                         of the procedure and the sedation options and risks                         were discussed with the patient. All questions were                         answered, and informed consent was obtained. Prior                         Anticoagulants: The patient has taken no previous                         anticoagulant or antiplatelet agents. ASA Grade                         Assessment: II - A patient with mild systemic disease.                         After reviewing the risks and benefits, the patient                         was deemed in satisfactory condition to undergo the  procedure.                        After obtaining informed consent, the colonoscope was                         passed under direct vision. Throughout the procedure,                         the patient's blood pressure, pulse, and oxygen                          saturations were monitored continuously. The                         Colonoscope was introduced through the anus and                         advanced to the the cecum, identified by appendiceal                         orifice and ileocecal valve. The colonoscopy was                         performed without difficulty. The patient tolerated                         the procedure well. The quality of the bowel                         preparation was excellent. Findings:      The perianal and digital rectal examinations were normal.      The colon (entire examined portion) appeared normal. Impression:            - The entire examined colon is normal.                        - No specimens collected. Recommendation:        - Discharge patient to home.                        - Resume previous diet.                        - Continue present medications.                        - Repeat colonoscopy in 5 years for surveillance. Procedure Code(s):     --- Professional ---                        [phone], Colonoscopy, flexible; diagnostic, including                         collection of specimen(s) by brushing or washing, when                         performed (separate procedure) Diagnosis Code(s):     --- Professional ---  Z83.71, Family history of colonic polyps CPT copyright 2019 American Medical Association. All rights reserved. The codes documented in this report are preliminary and upon coder review may  be revised to meet current compliance requirements. Midge Minium MD, MD 10/07/2020 9:59:17 AM This report has been signed electronically. Number of Addenda: 0 Note Initiated On: 10/07/2020 9:35 AM Scope Withdrawal Time: 0 hours 6 minutes 54 seconds  Total Procedure Duration: 0 hours 9 minutes 4 seconds  Estimated Blood Loss:  Estimated blood loss: none.      [facility]
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
## 2020-10-07 NOTE — Anesthesia Postprocedure Evaluation (Signed)
Anesthesia Post Note  Patient: Gabriel Dominguez.  Procedure(s) Performed: COLONOSCOPY WITH PROPOFOL (N/A ) ESOPHAGOGASTRODUODENOSCOPY (EGD) WITH PROPOFOL (N/A )  Patient location during evaluation: Endoscopy Anesthesia Type: General Level of consciousness: awake and alert Pain management: pain level controlled Vital Signs Assessment: post-procedure vital signs reviewed and stable Respiratory status: spontaneous breathing, nonlabored ventilation and respiratory function stable Cardiovascular status: blood pressure returned to baseline and stable Postop Assessment: no apparent nausea or vomiting Anesthetic complications: no   No complications documented.   Last Vitals:  Vitals:   10/07/20 1001 10/07/20 1031  BP: 102/85 (!) 113/91  Pulse: (!) 106   Resp: 13   Temp: (!) 36.4 C   SpO2: 93%     Last Pain:  Vitals:   10/07/20 1031  TempSrc:   PainSc: 0-No pain                 Christia Reading

## 2020-10-08 ENCOUNTER — Encounter: Payer: Self-pay | Admitting: Gastroenterology

## 2021-01-04 ENCOUNTER — Other Ambulatory Visit: Payer: Self-pay | Admitting: Gastroenterology

## 2021-04-22 ENCOUNTER — Ambulatory Visit: Payer: Federal, State, Local not specified - PPO | Admitting: Podiatry

## 2021-04-26 ENCOUNTER — Ambulatory Visit: Payer: Federal, State, Local not specified - PPO | Admitting: Podiatry

## 2021-04-26 ENCOUNTER — Other Ambulatory Visit: Payer: Self-pay

## 2021-04-26 DIAGNOSIS — L600 Ingrowing nail: Secondary | ICD-10-CM

## 2021-04-26 MED ORDER — GENTAMICIN SULFATE 0.1 % EX CREA: 1.0000 "application " | TOPICAL_CREAM | Freq: Two times a day (BID) | CUTANEOUS | 1 refills | Status: DC

## 2021-04-26 MED ORDER — DOXYCYCLINE HYCLATE 100 MG PO TABS: 100.0000 mg | ORAL_TABLET | Freq: Two times a day (BID) | ORAL | 0 refills | Status: DC
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
## 2021-05-13 ENCOUNTER — Ambulatory Visit: Payer: Federal, State, Local not specified - PPO | Admitting: Podiatry

## 2021-05-19 IMAGING — RF DG ESOPHAGUS
10 of 14 series · 14 of 22 positions shown · non-contrast
Comparison: None.

CLINICAL DATA: Difficulty swallowing choking for a few months

EXAM:
ESOPHOGRAM / BARIUM SWALLOW / BARIUM TABLET STUDY
TECHNIQUE: Combined double contrast and single contrast examination performed
using effervescent crystals, thick barium liquid, and thin barium
liquid. The patient was observed with fluoroscopy swallowing a 13 mm
barium sulphate tablet.
FLUOROSCOPY TIME:  Fluoroscopy Time:  0.8 minute
Radiation Exposure Index (if provided by the fluoroscopic device):
12.5 mGy
Number of Acquired Spot Images: 0

[Series 1: cp_standard · 0.25mm/px · 3 of 32 frames shown (1 of 10)]
[frame 5/32]
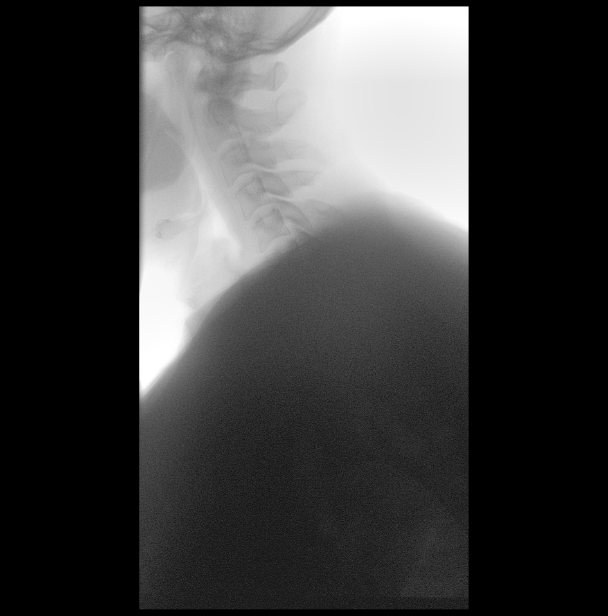
[frame 17/32]
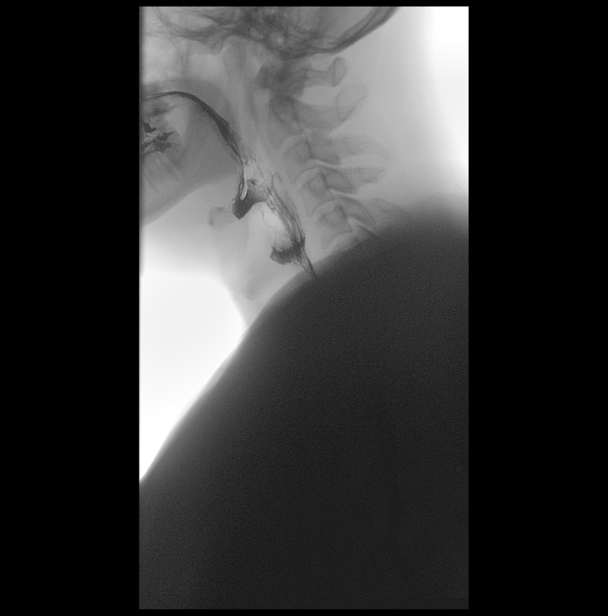
[frame 28/32]
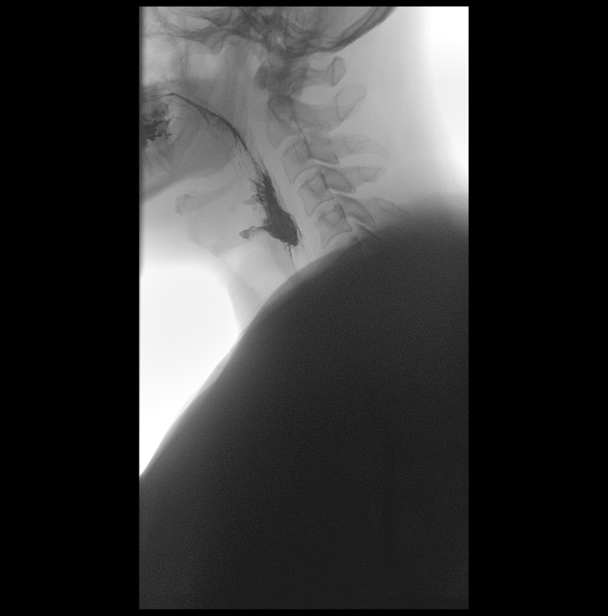

[Series 2: cp_standard · 0.25mm/px · 1 of 15 frames shown (2 of 10)]
[frame 8/15]
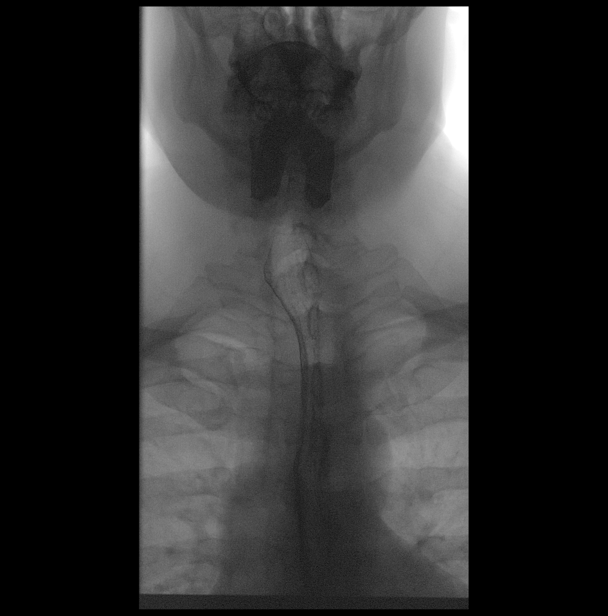

[Series 3: cp_standard · 0.25mm/px · 1 of 1 slices shown (3 of 10)]
[im 1/1]
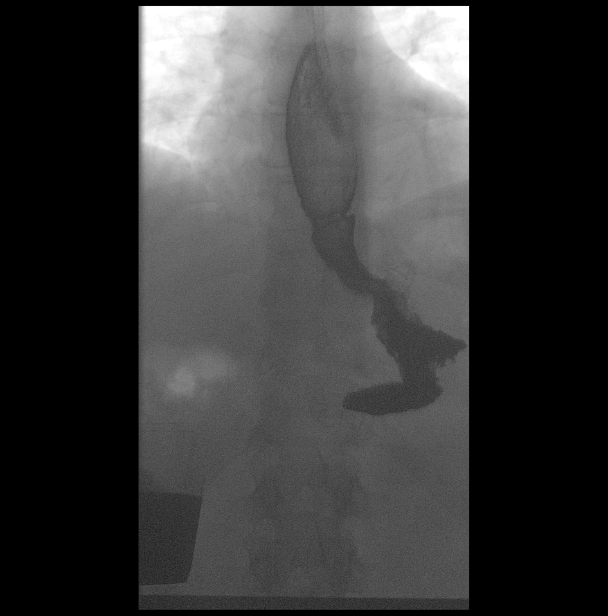

[Series 4: cp_standard · 0.25mm/px · 3 of 60 frames shown (4 of 10)]
[frame 10/60]
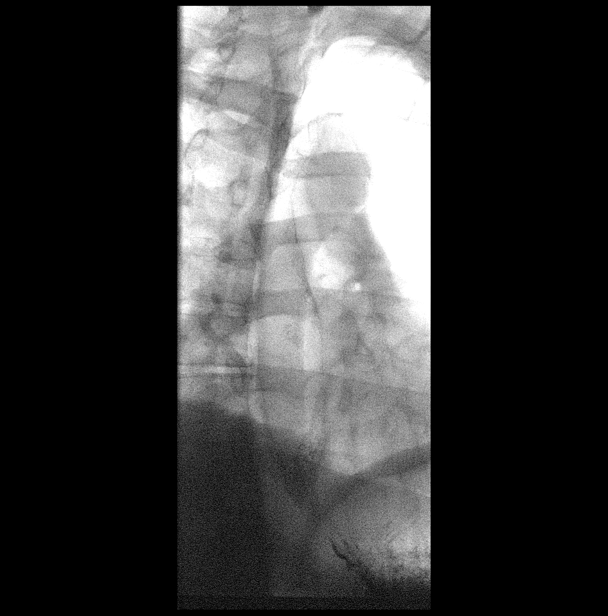
[frame 31/60]
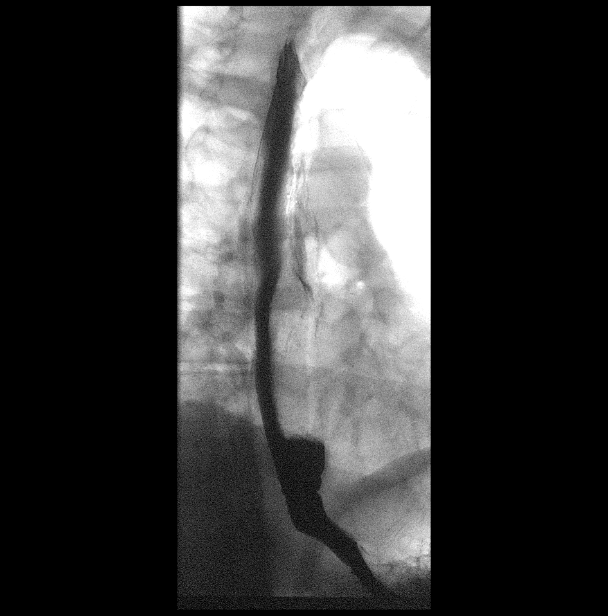
[frame 52/60]
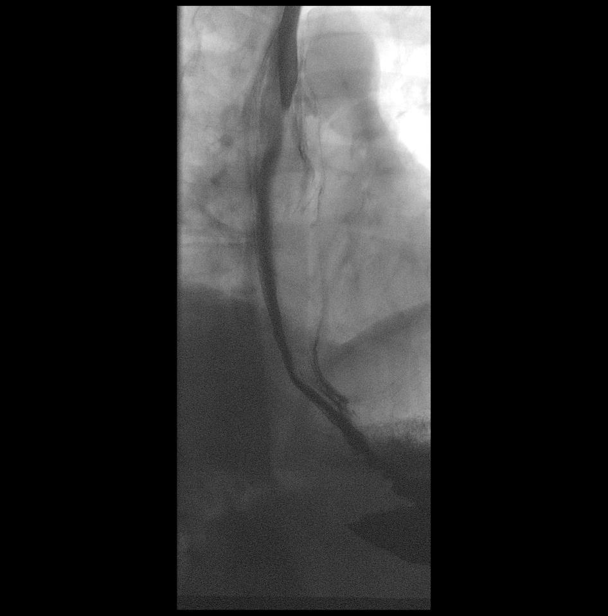

[Series 6: cp_standard · 0.17mm/px · 1 of 1 slices shown (5 of 10)]
[im 1/1]
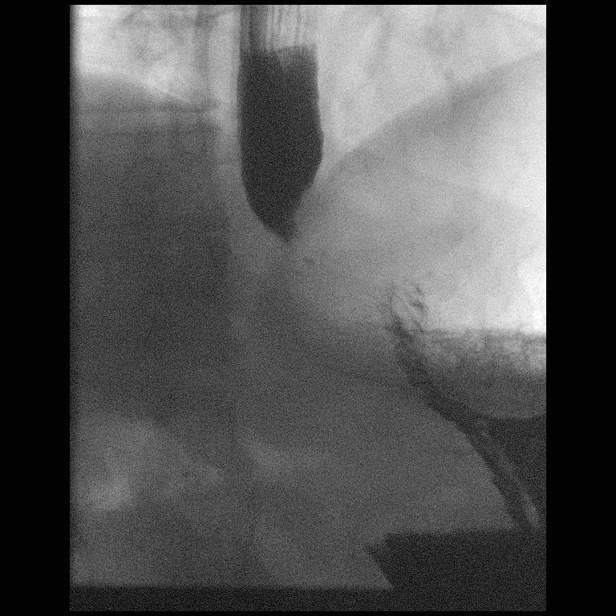

[Series 7: cp_standard · 0.17mm/px · 1 of 1 slices shown (6 of 10)]
[im 1/1]
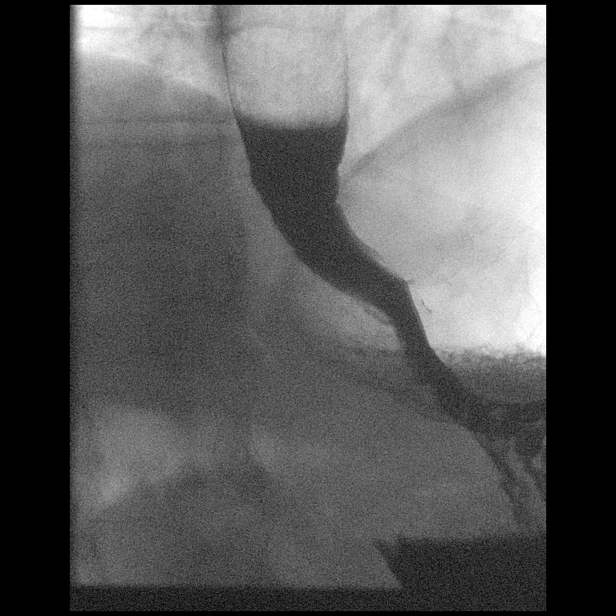

[Series 9: cp_standard · 0.28mm/px · 1 of 1 slices shown (7 of 10)]
[im 1/1]
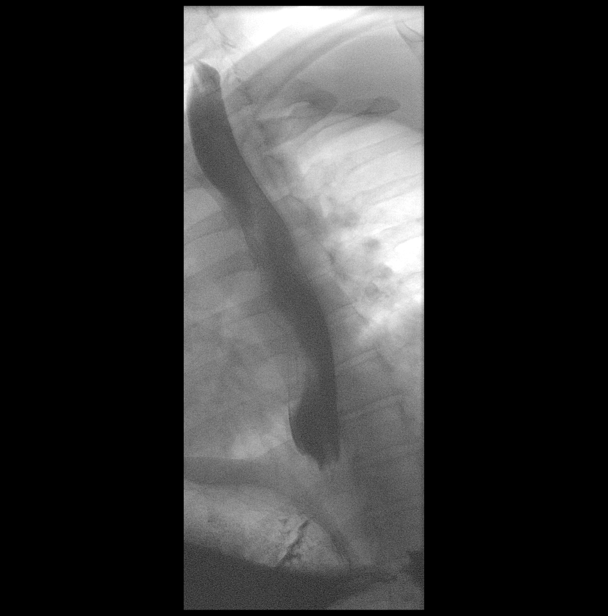

[Series 11: cp_standard · 0.28mm/px · 1 of 1 slices shown (8 of 10)]
[im 1/1]
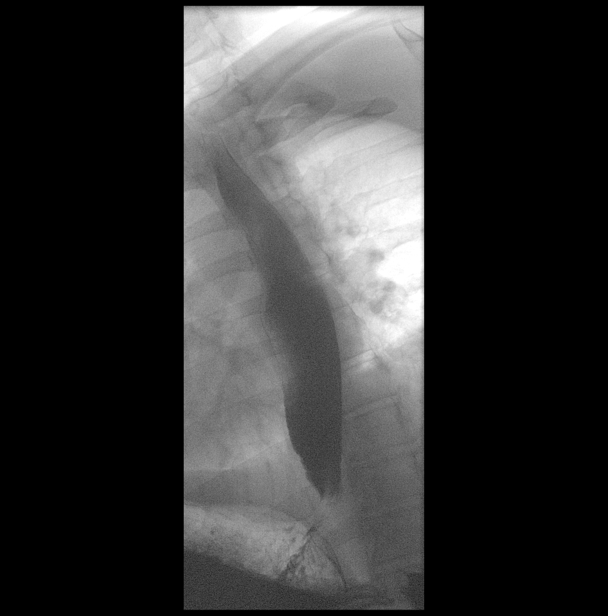

[Series 12: cp_standard · 0.28mm/px · 1 of 1 slices shown (9 of 10)]
[im 1/1]
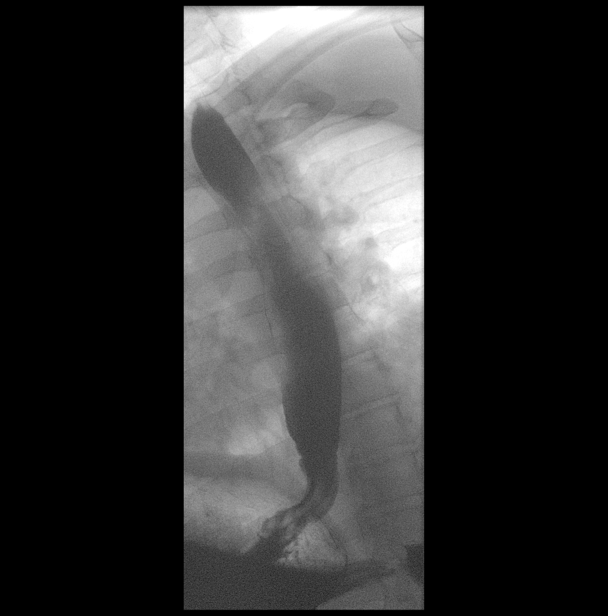

[Series 14: cp_standard · 0.28mm/px · 1 of 1 slices shown (10 of 10)]
[im 1/1]
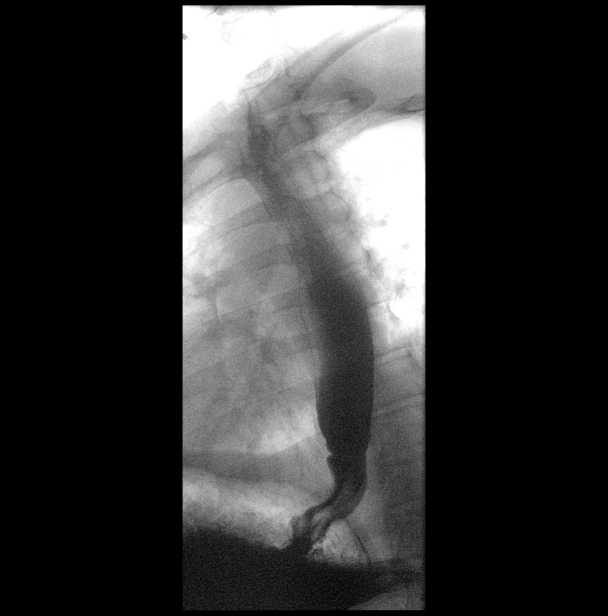

[14 of 22 positions shown; findings below may reference images not displayed]

FINDINGS: Normal pharyngeal anatomy and motility. Contrast flowed freely
through the esophagus without evidence of a mass. Mild distal
esophageal stricture just above the gastroesophageal junction which
does not restrict the passage of a barium tablet. Normal esophageal
mucosa without evidence of irregularity or ulceration. Esophageal
motility was normal. Mild gastroesophageal reflux. No definite
hiatal hernia was demonstrated.

At the end of the examination a 13 mm barium tablet was administered
which transited through the esophagus and esophagogastric junction
without delay.
IMPRESSION: 1. Mild distal esophageal stricture just above the gastroesophageal
junction which does not restrict the passage of a barium tablet.
2. Mild gastroesophageal reflux.

## 2021-11-23 ENCOUNTER — Encounter: Payer: Self-pay | Admitting: Gastroenterology

## 2021-11-23 ENCOUNTER — Ambulatory Visit (INDEPENDENT_AMBULATORY_CARE_PROVIDER_SITE_OTHER): Payer: Federal, State, Local not specified - PPO | Admitting: Gastroenterology

## 2021-11-23 VITALS — BP 138/95 | HR 65 | Temp 98.5°F | Ht 68.0 in | Wt 258.0 lb

## 2021-11-23 DIAGNOSIS — R1319 Other dysphagia: Secondary | ICD-10-CM

## 2021-11-23 MED ORDER — PANTOPRAZOLE SODIUM 40 MG PO TBEC: 40.0000 mg | DELAYED_RELEASE_TABLET | Freq: Every day | ORAL | 2 refills | Status: DC
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
## 2021-11-25 ENCOUNTER — Ambulatory Visit
Admission: RE | Admit: 2021-11-25 | Discharge: 2021-11-25 | Disposition: A | Discharge status: Home / Self Care | Payer: Federal, State, Local not specified - PPO | Attending: Gastroenterology | Admitting: Gastroenterology

## 2021-11-25 ENCOUNTER — Ambulatory Visit: Payer: Federal, State, Local not specified - PPO | Admitting: Anesthesiology

## 2021-11-25 ENCOUNTER — Encounter
Admission: RE | Disposition: A | Discharge status: Home / Self Care | Payer: Self-pay | Source: Home / Self Care | Attending: Gastroenterology

## 2021-11-25 ENCOUNTER — Encounter: Payer: Self-pay | Admitting: Gastroenterology

## 2021-11-25 ENCOUNTER — Other Ambulatory Visit: Payer: Self-pay

## 2021-11-25 DIAGNOSIS — K449 Diaphragmatic hernia without obstruction or gangrene: Secondary | ICD-10-CM | POA: Diagnosis not present

## 2021-11-25 DIAGNOSIS — K2970 Gastritis, unspecified, without bleeding: Secondary | ICD-10-CM | POA: Insufficient documentation

## 2021-11-25 DIAGNOSIS — R1319 Other dysphagia: Secondary | ICD-10-CM

## 2021-11-25 DIAGNOSIS — K2980 Duodenitis without bleeding: Secondary | ICD-10-CM | POA: Insufficient documentation

## 2021-11-25 DIAGNOSIS — R1310 Dysphagia, unspecified: Secondary | ICD-10-CM | POA: Diagnosis present

## 2021-11-25 HISTORY — PX: ESOPHAGOGASTRODUODENOSCOPY: SHX5428

## 2021-11-25 SURGERY — EGD (ESOPHAGOGASTRODUODENOSCOPY)
Anesthesia: General | Site: Mouth

## 2021-11-25 MED ORDER — PROPOFOL 10 MG/ML IV BOLUS
INTRAVENOUS | Status: DC | PRN
  Administered 2021-11-25: 50 mg via INTRAVENOUS
  Administered 2021-11-25 (×2): 30 mg via INTRAVENOUS
  Administered 2021-11-25: 50 mg via INTRAVENOUS
  Administered 2021-11-25: 150 mg via INTRAVENOUS
  Administered 2021-11-25: 30 mg via INTRAVENOUS
  Administered 2021-11-25: 50 mg via INTRAVENOUS
  Administered 2021-11-25: 30 mg via INTRAVENOUS

## 2021-11-25 MED ORDER — SODIUM CHLORIDE 0.9 % IV SOLN: INTRAVENOUS | Status: DC

## 2021-11-25 MED ORDER — FENTANYL CITRATE PF 50 MCG/ML IJ SOSY: 25.0000 ug | PREFILLED_SYRINGE | INTRAMUSCULAR | Status: DC | PRN

## 2021-11-25 MED ORDER — GLYCOPYRROLATE 0.2 MG/ML IJ SOLN
INTRAMUSCULAR | Status: DC | PRN
  Administered 2021-11-25: .1 mg via INTRAVENOUS

## 2021-11-25 MED ORDER — OXYCODONE HCL 5 MG/5ML PO SOLN: 5.0000 mg | Freq: Once | ORAL | Status: DC | PRN

## 2021-11-25 MED ORDER — LACTATED RINGERS IV SOLN
INTRAVENOUS | Status: DC
  Administered 2021-11-25: 999 mL via INTRAVENOUS

## 2021-11-25 MED ORDER — LIDOCAINE HCL (CARDIAC) PF 100 MG/5ML IV SOSY
PREFILLED_SYRINGE | INTRAVENOUS | Status: DC | PRN
  Administered 2021-11-25: 30 mg via INTRAVENOUS

## 2021-11-25 MED ORDER — MEPERIDINE HCL 25 MG/ML IJ SOLN: 6.2500 mg | INTRAMUSCULAR | Status: DC | PRN

## 2021-11-25 MED ORDER — OXYCODONE HCL 5 MG PO TABS: 5.0000 mg | ORAL_TABLET | Freq: Once | ORAL | Status: DC | PRN

## 2021-11-25 SURGICAL SUPPLY — 34 items
BALLN DILATOR 10-12 8 (BALLOONS)
BALLN DILATOR 12-15 8 (BALLOONS)
BALLN DILATOR 15-18 8 (BALLOONS) ×2
BALLN DILATOR CRE 0-12 8 (BALLOONS)
BALLN DILATOR ESOPH 8 10 CRE (MISCELLANEOUS) IMPLANT
BALLOON DILATOR 12-15 8 (BALLOONS) IMPLANT
BALLOON DILATOR 15-18 8 (BALLOONS) IMPLANT
BALLOON DILATOR CRE 0-12 8 (BALLOONS) IMPLANT
BLOCK BITE 60FR ADLT L/F GRN (MISCELLANEOUS) ×2 IMPLANT
CLIP HMST 235XBRD CATH ROT (MISCELLANEOUS) IMPLANT
CLIP RESOLUTION 360 11X235 (MISCELLANEOUS)
ELECT REM PT RETURN 9FT ADLT (ELECTROSURGICAL)
ELECTRODE REM PT RTRN 9FT ADLT (ELECTROSURGICAL) IMPLANT
FCP ESCP3.2XJMB 240X2.8X (MISCELLANEOUS) ×1
FORCEPS BIOP RAD 4 LRG CAP 4 (CUTTING FORCEPS) IMPLANT
FORCEPS BIOP RJ4 240 W/NDL (MISCELLANEOUS) ×2
FORCEPS ESCP3.2XJMB 240X2.8X (MISCELLANEOUS) IMPLANT
GOWN CVR UNV OPN BCK APRN NK (MISCELLANEOUS) ×2 IMPLANT
GOWN ISOL THUMB LOOP REG UNIV (MISCELLANEOUS) ×4
INJECTOR VARIJECT VIN23 (MISCELLANEOUS) IMPLANT
KIT DEFENDO VALVE AND CONN (KITS) IMPLANT
KIT PRC NS LF DISP ENDO (KITS) ×1 IMPLANT
KIT PROCEDURE OLYMPUS (KITS) ×2
MANIFOLD NEPTUNE II (INSTRUMENTS) ×2 IMPLANT
MARKER SPOT ENDO TATTOO 5ML (MISCELLANEOUS) IMPLANT
RETRIEVER NET PLAT FOOD (MISCELLANEOUS) IMPLANT
SNARE SHORT THROW 13M SML OVAL (MISCELLANEOUS) IMPLANT
SNARE SHORT THROW 30M LRG OVAL (MISCELLANEOUS) IMPLANT
SPOT EX ENDOSCOPIC TATTOO (MISCELLANEOUS)
SYR INFLATION 60ML (SYRINGE) ×1 IMPLANT
TRAP ETRAP POLY (MISCELLANEOUS) IMPLANT
VARIJECT INJECTOR VIN23 (MISCELLANEOUS)
WATER STERILE IRR 250ML POUR (IV SOLUTION) ×2 IMPLANT
WIRE CRE 18-20MM 8CM F G (MISCELLANEOUS) IMPLANT

## 2021-11-25 NOTE — Anesthesia Postprocedure Evaluation (Signed)
Anesthesia Post Note ? ?Patient: Gabriel Dominguez. ? ?Procedure(s) Performed: ESOPHAGOGASTRODUODENOSCOPY (EGD) (Mouth) ? ? ?  ?Patient location during evaluation: PACU ?Anesthesia Type: General ?Level of consciousness: awake and alert ?Pain management: pain level controlled ?Vital Signs Assessment: post-procedure vital signs reviewed and stable ?Respiratory status: spontaneous breathing, nonlabored ventilation, respiratory function stable and patient connected to nasal cannula oxygen ?Cardiovascular status: blood pressure returned to baseline and stable ?Postop Assessment: no apparent nausea or vomiting ?Anesthetic complications: no ? ? ?No notable events documented. ? ?Shanetha Bradham ELAINE ? ? ? ? ? ?

## 2021-11-25 NOTE — Op Note (Addendum)
[facility] ?Gastroenterology ?Patient Name: Gabriel Dominguez ?Procedure Date: 11/25/2021 8:57 AM ?MRN: [id number] ?Account #: [id number] ?Date of Birth: [birth year] ?Admit Type: Outpatient ?Age: 41 ?Room: [facility] OR ROOM 01 ?Gender: Male ?Note Status: Finalized ?Instrument Name: [id number] ?Procedure:             Upper GI endoscopy ?Indications:           Dysphagia ?Providers:             Midge Minium MD, MD ?Referring MD:          Lyn Records. Arnett (Referring MD) ?Medicines:             Propofol per Anesthesia ?Complications:         No immediate complications. ?Procedure:             Pre-Anesthesia Assessment: ?                       - Prior to the procedure, a History and Physical was  ?                       performed, and patient medications and allergies were  ?                       reviewed. The patient's tolerance of previous  ?                       anesthesia was also reviewed. The risks and benefits  ?                       of the procedure and the sedation options and risks  ?                       were discussed with the patient. All questions were  ?                       answered, and informed consent was obtained. Prior  ?                       Anticoagulants: The patient has taken no previous  ?                       anticoagulant or antiplatelet agents. ASA Grade  ?                       Assessment: II - A patient with mild systemic disease.  ?                       After reviewing the risks and benefits, the patient  ?                       was deemed in satisfactory condition to undergo the  ?                       procedure. ?                       After obtaining informed consent, the endoscope was  ?  passed under direct vision. Throughout the procedure,  ?                       the patient's blood pressure, pulse, and oxygen  ?                       saturations were monitored continuously. The Endoscope  ?                       was introduced through the mouth,  and advanced to the  ?                       second part of duodenum. The upper GI endoscopy was  ?                       accomplished without difficulty. The patient tolerated  ?                       the procedure well. ?Findings: ?     A small hiatal hernia was present. ?     The exam of the esophagus was otherwise normal. ?     A TTS dilator was passed through the scope. Dilation with a 15-16.5-18  ?     mm balloon dilator was performed to 18 mm in the entire esophagus. ?     Localized mild inflammation characterized by erythema was found in the  ?     gastric antrum. Biopsies were taken with a cold forceps for histology. ?     Patchy moderate inflammation characterized by erythema was found in the  ?     entire duodenum. Biopsies were taken with a cold forceps for histology. ?Impression:            - Small hiatal hernia. ?                       - Gastritis. Biopsied. ?                       - Duodenitis. Biopsied. ?                       - Dilation performed in the entire esophagus. ?Recommendation:        - Discharge patient to home. ?                       - Resume previous diet. ?                       - Continue present medications. ?                       - Await pathology results. ?Procedure Code(s):     --- Professional --- ?                       [phone], Esophagogastroduodenoscopy, flexible,  ?                       transoral; with transendoscopic balloon dilation of  ?                       esophagus (less than  30 mm diameter) ?                       43239, 59, Esophagogastroduodenoscopy, flexible,  ?                       transoral; with biopsy, single or multiple ?Diagnosis Code(s):     --- Professional --- ?                       R13.10, Dysphagia, unspecified ?                       K29.70, Gastritis, unspecified, without bleeding ?CPT copyright 2019 American Medical Association. All rights reserved. ?The codes documented in this report are preliminary and upon coder review may  ?be revised to meet  current compliance requirements. ?Midge Minium MD, MD ?11/25/2021 9:21:58 AM ?This report has been signed electronically. ?Number of Addenda: 0 ?Note Initiated On: 11/25/2021 8:57 AM ?Estimated Blood Loss:  Estimated blood loss: none. ?     [facility] ?

## 2021-11-25 NOTE — Transfer of Care (Signed)
Immediate Anesthesia Transfer of Care Note ? ?Patient: Gabriel Dominguez. ? ?Procedure(s) Performed: ESOPHAGOGASTRODUODENOSCOPY (EGD) (Mouth) ? ?Patient Location: PACU ? ?Anesthesia Type: General ? ?Level of Consciousness: awake, alert  and patient cooperative ? ?Airway and Oxygen Therapy: Patient Spontanous Breathing and Patient connected to supplemental oxygen ? ?Post-op Assessment: Post-op Vital signs reviewed, Patient's Cardiovascular Status Stable, Respiratory Function Stable, Patent Airway and No signs of Nausea or vomiting ? ?Post-op Vital Signs: Reviewed and stable ? ?Complications: No notable events documented. ? ?

## 2021-11-25 NOTE — Anesthesia Preprocedure Evaluation (Signed)
Anesthesia Evaluation  Patient identified by MRN, date of birth, ID band Patient awake    Reviewed: Allergy & Precautions, H&P , NPO status , Patient's Chart, lab work & pertinent test results, reviewed documented beta blocker date and time   Airway Mallampati: II  TM Distance: >3 FB Neck ROM: full    Dental no notable dental hx.    Pulmonary neg pulmonary ROS,    Pulmonary exam normal breath sounds clear to auscultation       Cardiovascular Exercise Tolerance: Good negative cardio ROS   Rhythm:regular Rate:Normal     Neuro/Psych negative neurological ROS  negative psych ROS   GI/Hepatic negative GI ROS, Neg liver ROS,   Endo/Other  negative endocrine ROS  Renal/GU negative Renal ROS  negative genitourinary   Musculoskeletal   Abdominal   Peds  Hematology negative hematology ROS (+)   Anesthesia Other Findings   Reproductive/Obstetrics negative OB ROS                             Anesthesia Physical Anesthesia Plan  ASA: 2  Anesthesia Plan: General   Post-op Pain Management:    Induction:   PONV Risk Score and Plan: 2  Airway Management Planned:   Additional Equipment:   Intra-op Plan:   Post-operative Plan:   Informed Consent: I have reviewed the patients History and Physical, chart, labs and discussed the procedure including the risks, benefits and alternatives for the proposed anesthesia with the patient or authorized representative who has indicated his/her understanding and acceptance.     Dental Advisory Given  Plan Discussed with: CRNA  Anesthesia Plan Comments:         Anesthesia Quick Evaluation  
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
## 2021-11-25 NOTE — Anesthesia Procedure Notes (Signed)
Date/Time: 11/25/2021 9:07 AM ?Performed by: Maree Krabbe, CRNA ?Pre-anesthesia Checklist: Patient identified, Emergency Drugs available, Suction available, Timeout performed and Patient being monitored ?Patient Re-evaluated:Patient Re-evaluated prior to induction ?Oxygen Delivery Method: Nasal cannula ?Placement Confirmation: positive ETCO2 ? ? ? ? ?

## 2021-11-28 LAB — SURGICAL PATHOLOGY

## 2021-11-30 ENCOUNTER — Encounter: Payer: Self-pay | Admitting: Gastroenterology

## 2022-02-17 ENCOUNTER — Other Ambulatory Visit: Payer: Self-pay | Admitting: Gastroenterology
